# Patient Record
Sex: Male | Born: 1945 | Race: White | Hispanic: No | Marital: Married | Smoking: Never smoker
Health system: Southern US, Community
[De-identification: ages and names within clinical notes are randomized; demographics above are authoritative.]

## PROBLEM LIST (undated history)

## (undated) DIAGNOSIS — Z8546 Personal history of malignant neoplasm of prostate: Secondary | ICD-10-CM

## (undated) DIAGNOSIS — K219 Gastro-esophageal reflux disease without esophagitis: Secondary | ICD-10-CM

## (undated) DIAGNOSIS — I4891 Unspecified atrial fibrillation: Secondary | ICD-10-CM

## (undated) DIAGNOSIS — M5430 Sciatica, unspecified side: Secondary | ICD-10-CM

## (undated) DIAGNOSIS — M1990 Unspecified osteoarthritis, unspecified site: Secondary | ICD-10-CM

## (undated) DIAGNOSIS — G4730 Sleep apnea, unspecified: Secondary | ICD-10-CM

## (undated) HISTORY — PX: NOSE SURGERY: SHX723

## (undated) HISTORY — PX: PROSTATECTOMY: SHX69

## (undated) HISTORY — DX: Unspecified atrial fibrillation: I48.91

## (undated) HISTORY — DX: Sleep apnea, unspecified: G47.30

## (undated) HISTORY — PX: KNEE ARTHROSCOPY: SUR90

## (undated) HISTORY — PX: SHOULDER ARTHROSCOPY: SHX128

## (undated) HISTORY — PX: WISDOM TOOTH EXTRACTION: SHX21

## (undated) HISTORY — DX: Personal history of malignant neoplasm of prostate: Z85.46

---

## 2006-04-28 ENCOUNTER — Ambulatory Visit: Payer: Self-pay | Admitting: Gastroenterology

## 2007-09-28 ENCOUNTER — Ambulatory Visit: Payer: Self-pay | Admitting: Internal Medicine

## 2010-04-26 ENCOUNTER — Ambulatory Visit: Payer: Self-pay | Admitting: Internal Medicine

## 2010-09-08 ENCOUNTER — Inpatient Hospital Stay: Payer: Self-pay | Admitting: Internal Medicine

## 2010-10-11 ENCOUNTER — Ambulatory Visit: Payer: Self-pay | Admitting: Unknown Physician Specialty

## 2010-12-05 ENCOUNTER — Ambulatory Visit: Payer: Self-pay | Admitting: Unknown Physician Specialty

## 2010-12-18 ENCOUNTER — Ambulatory Visit: Payer: Self-pay | Admitting: Unknown Physician Specialty

## 2011-03-12 ENCOUNTER — Ambulatory Visit: Payer: Self-pay | Admitting: Internal Medicine

## 2011-03-26 ENCOUNTER — Ambulatory Visit: Payer: Self-pay | Admitting: Gastroenterology

## 2011-03-28 LAB — PATHOLOGY REPORT

## 2011-07-22 ENCOUNTER — Ambulatory Visit: Payer: Self-pay | Admitting: Family

## 2012-02-05 ENCOUNTER — Ambulatory Visit: Payer: Self-pay | Admitting: Internal Medicine

## 2013-02-03 ENCOUNTER — Ambulatory Visit: Payer: Self-pay | Admitting: Urology

## 2013-07-17 DIAGNOSIS — N529 Male erectile dysfunction, unspecified: Secondary | ICD-10-CM | POA: Insufficient documentation

## 2013-12-13 ENCOUNTER — Ambulatory Visit: Payer: Self-pay | Admitting: Gastroenterology

## 2013-12-15 LAB — PATHOLOGY REPORT

## 2014-01-10 ENCOUNTER — Ambulatory Visit: Payer: Self-pay | Admitting: Otolaryngology

## 2014-05-25 ENCOUNTER — Ambulatory Visit: Payer: Self-pay | Admitting: Internal Medicine

## 2014-10-27 IMAGING — CT CT PARANASAL SINUSES W/O CM
3 series · 14 of 47 positions shown, 16 images · non-contrast
Comparison: 04/26/2010 head CT.

CLINICAL DATA: Congestion and hoarseness. Feels as if ears are
clogged. No sense of smell.

EXAM:
CT PARANASAL SINUS WITHOUT CONTRAST
TECHNIQUE: Multidetector CT images of the paranasal sinuses were obtained using
the standard protocol without intravenous contrast.

[Series 3: ax soft · axial · 0.31mm/px · z∈[-116,+43]mm · 8 of 185 slices shown, 10 images]
[im 13/185  brain]
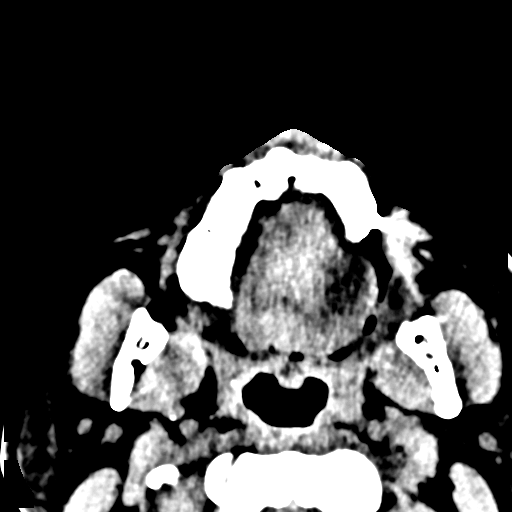
[im 13/185  bone]
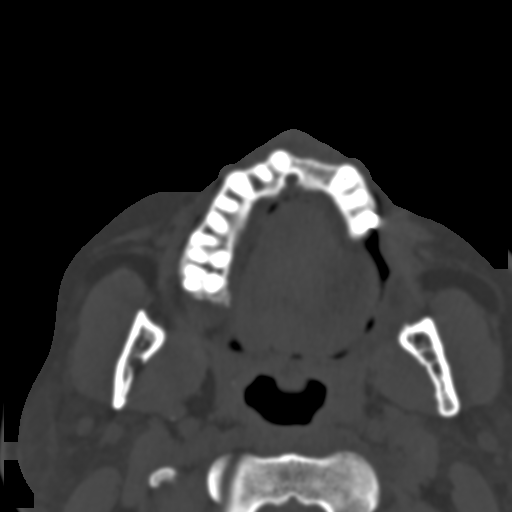
[im 39/185  bone]
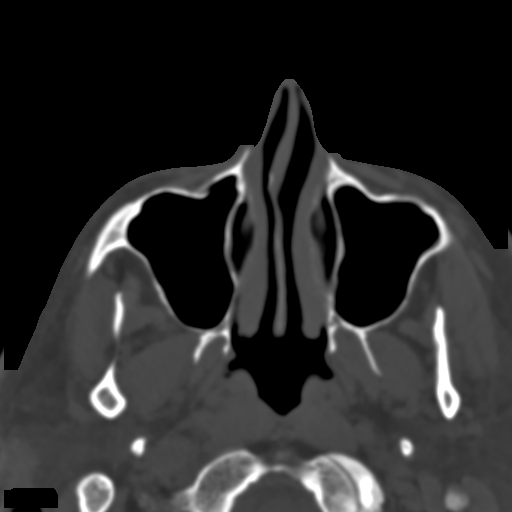
[im 58/185  bone]
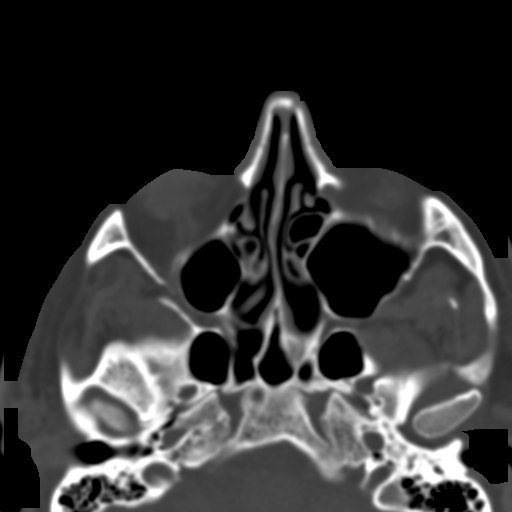
[im 83/185  bone]
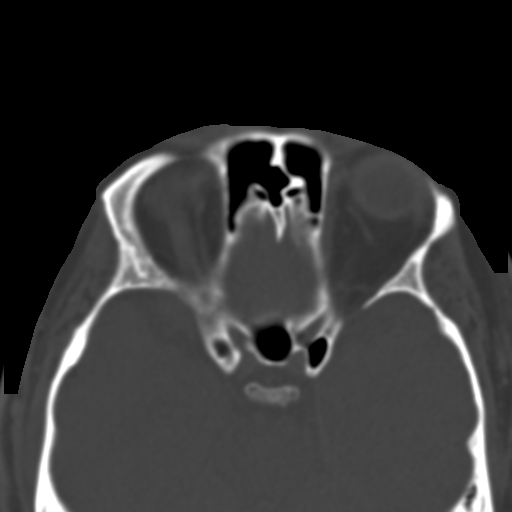
[im 102/185  brain]
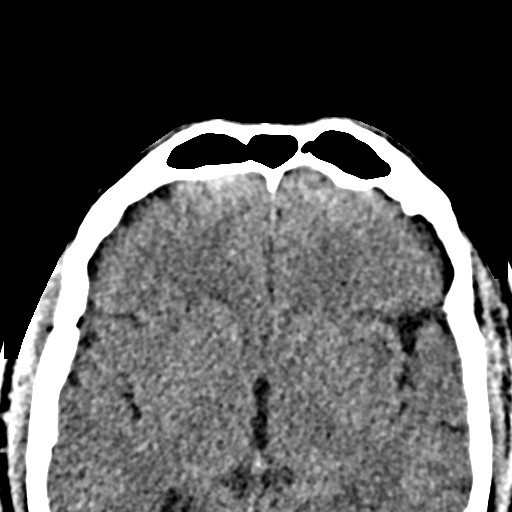
[im 102/185  bone]
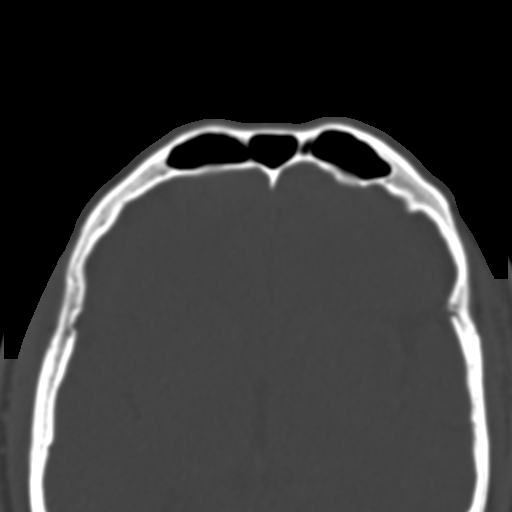
[im 127/185  bone]
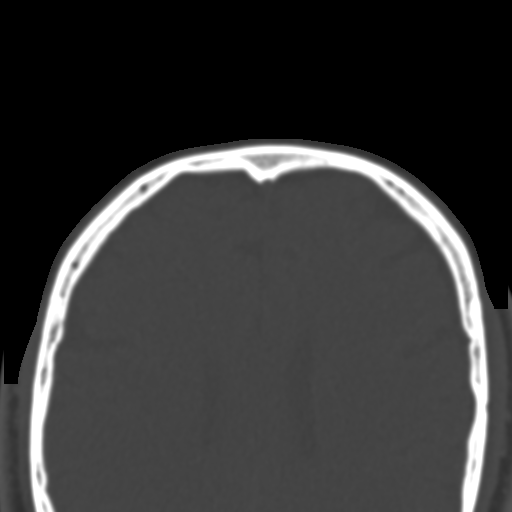
[im 146/185  bone]
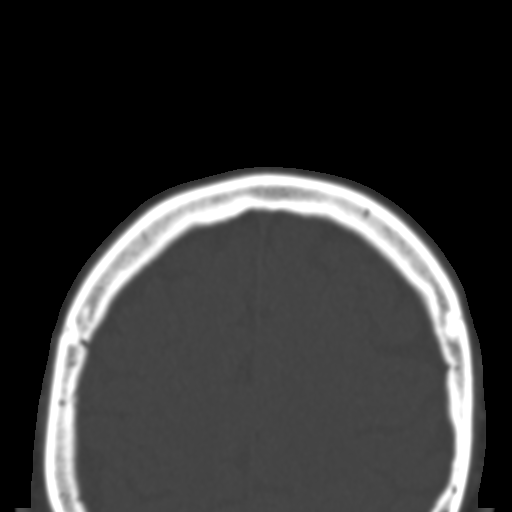
[im 172/185  bone]
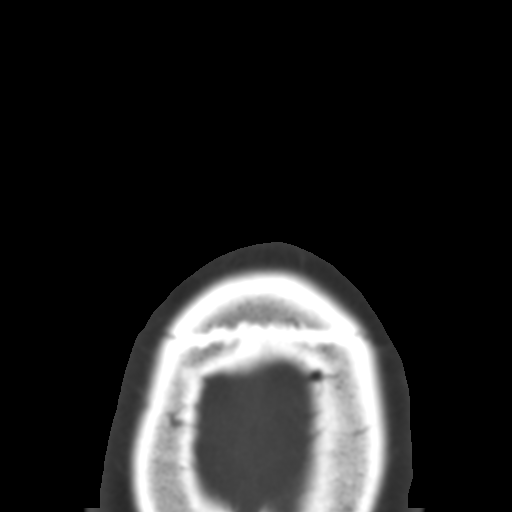

[Series 4: coronal bone · coronal · 0.33mm/px · 3 of 74 slices shown]
[im 25/74  bone]
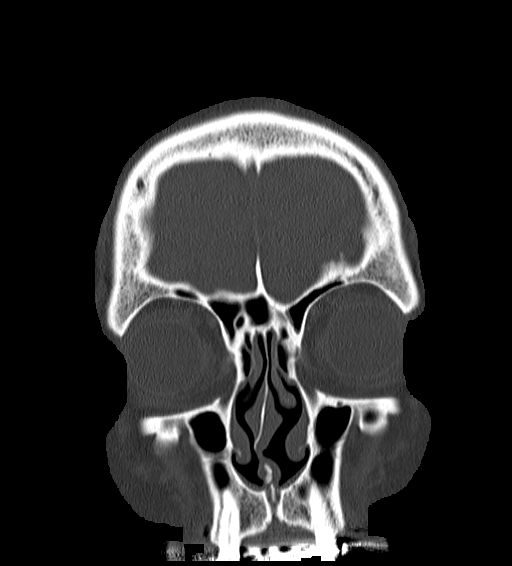
[im 33/74  bone]
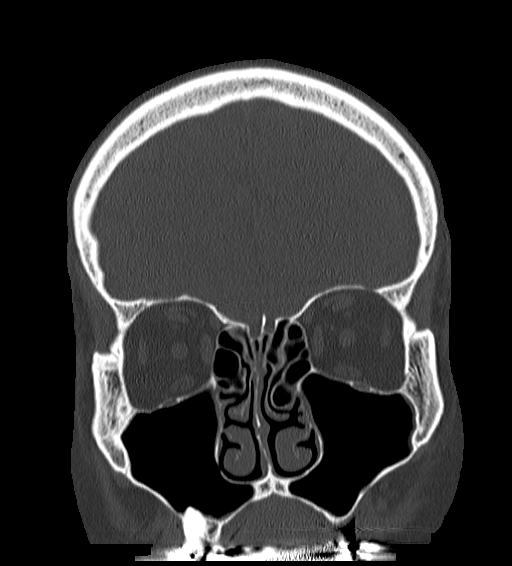
[im 41/74  bone]
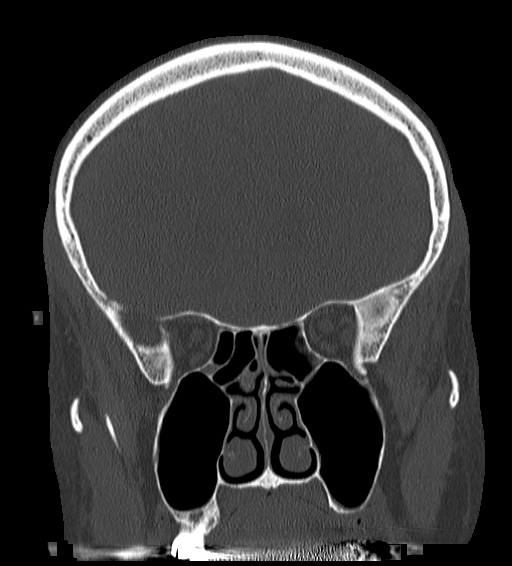

[Series 5: sagittal bone · sagittal · 0.29mm/px · 3 of 81 slices shown]
[im 27/81  bone]
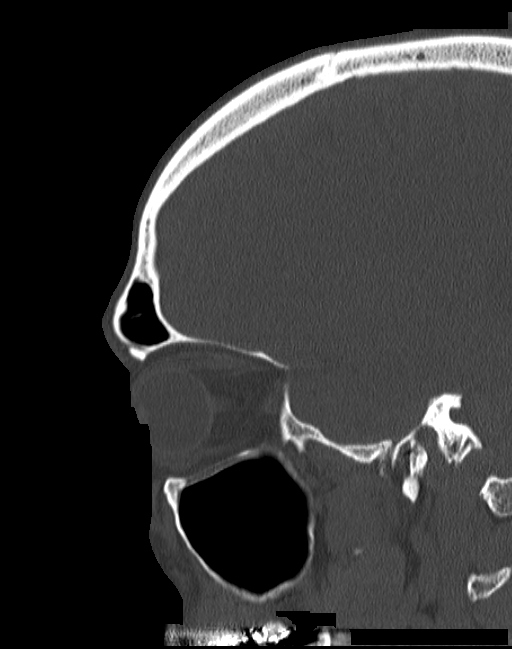
[im 41/81  bone]
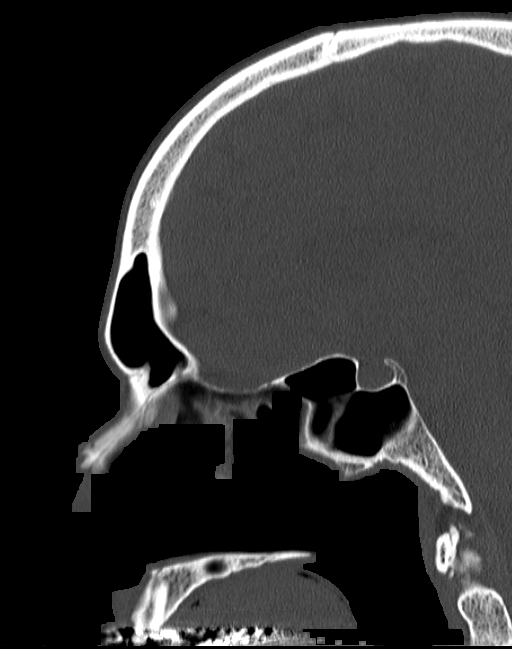
[im 54/81  bone]
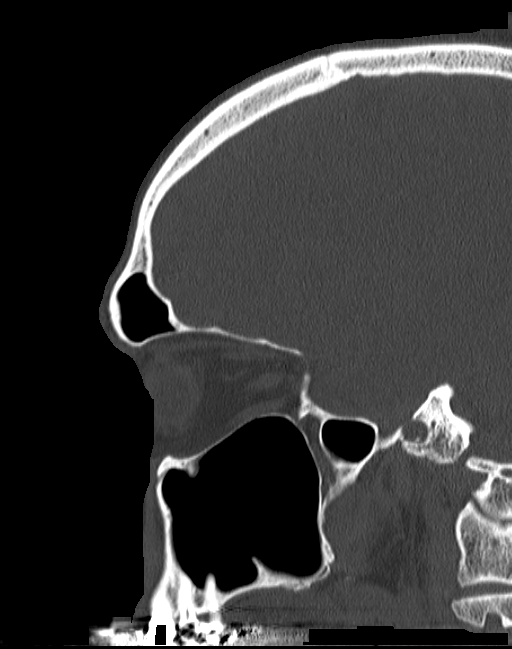

[14 of 47 positions shown; findings below may reference images not displayed]

FINDINGS: Minimal mucosal thickening inferior aspect right maxillary sinus and
lateral aspect left maxillary sinus.

Prominent anterior left ethmoid sinus air cell causes slight
deformity of the left uncinate process. Mild mucosal thickening
along the course of the infundibulum bilaterally which remains
patent.

Nasal septum slightly deviated to the right.

Frontal sinuses are clear.

Minimal mucosal thickening anterior left ethmoid sinus air cell
otherwise ethmoid sinus air cells are clear.

Sphenoid sinus air cells are clear. Aeration upper aspect of the
pterygoid plates clear. The carotid canal extends into the posterior
lateral aspect of the sphenoid sinus air cell bilaterally. Aeration
of the anterior clinoid. Minimal thinning of the bony cover of the
optic nerve as traverses through the superior lateral aspect of the
sphenoid sinus.

Middle ear cavities are clear bilaterally. Visualized mastoid air
cells are clear.

Visualized intracranial structures and orbital structures are
unremarkable.
IMPRESSION: Minimal mucosal thickening inferior aspect right maxillary sinus and
lateral aspect left maxillary sinus.

Prominent anterior left ethmoid sinus air cell causes slight
deformity of the left uncinate process. Mild mucosal thickening
along the course of patent infundibulum bilaterally.

Frontal sinuses are clear.

Minimal mucosal thickening anterior left ethmoid sinus air cell
otherwise ethmoid sinus air cells are clear. Cribriform plate
slightly asymmetric lower on the right.

Sphenoid sinus air cells are clear. Aeration upper aspect of the
pterygoid plates clear. The carotid canal extends into the posterior
lateral aspect of the sphenoid sinus air cell bilaterally. Aeration
of the anterior clinoid. Minimal thinning of the bony cover of the
optic nerve as traverses through the superior lateral aspect of the
sphenoid sinus.

Middle ear cavities are clear bilaterally. Visualized mastoid air
cells are clear.

## 2015-10-03 ENCOUNTER — Inpatient Hospital Stay: Admit: 2015-10-03 | Payer: Self-pay

## 2015-10-03 ENCOUNTER — Ambulatory Visit
Admission: RE | Admit: 2015-10-03 | Discharge: 2015-10-03 | Disposition: A | Discharge status: Home / Self Care | Payer: Medicare Other | Source: Ambulatory Visit | Attending: Internal Medicine | Admitting: Internal Medicine

## 2015-10-03 ENCOUNTER — Other Ambulatory Visit: Payer: Self-pay | Admitting: Internal Medicine

## 2015-10-03 ENCOUNTER — Ambulatory Visit
Admission: RE | Admit: 2015-10-03 | Discharge: 2015-10-03 | Disposition: A | Discharge status: Home / Self Care | Payer: Medicare Other | Source: Ambulatory Visit | Attending: Cardiology | Admitting: Cardiology

## 2015-10-03 DIAGNOSIS — J189 Pneumonia, unspecified organism: Secondary | ICD-10-CM | POA: Diagnosis present

## 2015-10-03 DIAGNOSIS — R0602 Shortness of breath: Secondary | ICD-10-CM

## 2015-10-03 DIAGNOSIS — R079 Chest pain, unspecified: Secondary | ICD-10-CM | POA: Insufficient documentation

## 2016-04-04 ENCOUNTER — Other Ambulatory Visit: Payer: Self-pay

## 2016-09-04 ENCOUNTER — Ambulatory Visit: Payer: Medicare Other | Attending: Neurology

## 2016-09-04 DIAGNOSIS — G4733 Obstructive sleep apnea (adult) (pediatric): Secondary | ICD-10-CM | POA: Diagnosis not present

## 2017-08-19 ENCOUNTER — Ambulatory Visit: Payer: Self-pay | Admitting: Urology

## 2017-08-26 ENCOUNTER — Ambulatory Visit (INDEPENDENT_AMBULATORY_CARE_PROVIDER_SITE_OTHER): Payer: Medicare Other | Admitting: Urology

## 2017-08-26 ENCOUNTER — Encounter: Payer: Self-pay | Admitting: Urology

## 2017-08-26 VITALS — BP 133/76 | HR 67 | Ht 72.0 in | Wt 249.4 lb

## 2017-08-26 DIAGNOSIS — Z8546 Personal history of malignant neoplasm of prostate: Secondary | ICD-10-CM | POA: Diagnosis not present

## 2017-08-26 DIAGNOSIS — R350 Frequency of micturition: Secondary | ICD-10-CM | POA: Diagnosis not present

## 2017-08-26 HISTORY — DX: Personal history of malignant neoplasm of prostate: Z85.46

## 2017-08-26 MED ORDER — MIRABEGRON ER 25 MG PO TB24: 25.0000 mg | ORAL_TABLET | Freq: Every day | ORAL | 0 refills | Status: DC

## 2017-08-26 NOTE — Progress Notes (Signed)
08/26/2017 1:53 PM   Darren Alu Sr. 1945-12-30 [id number]  Referring provider: Cletis Athens, MD [address] [place], [place] [address]  Chief Complaint  Patient presents with  . history of prostate cancer    HPI: 72 year old male presents for follow-up.  I previously followed him at [facility] and he was last seen in December 2017.  He underwent radical prostatectomy at [facility] in January 2005 for prostate cancer.  His PSA has remained undetectable.  He has been on Toviaz for several years for urinary frequency and urgency.  He has stable lower urinary tract symptoms.  Denies dysuria or gross hematuria.  He denies flank, abdominal, pelvic or scrotal pain.  He does complain of dry mouth and constipation with Lisbeth Ply and was asking about any alternative medications.  He was also on Edex for erectile dysfunction however his wife was recently hospitalized with sepsis and had an extended hospital stay and rehabilitation and he is currently not sexually active.   PMH: Past Medical History:  Diagnosis Date  . Atrial fibrillation ([place])   . History of prostate cancer   . Sleep apnea     Surgical History: Past Surgical History:  Procedure Laterality Date  . KNEE ARTHROSCOPY    . NOSE SURGERY    . PROSTATECTOMY    . SHOULDER ARTHROSCOPY    . WISDOM TOOTH EXTRACTION      Home Medications:  Allergies as of 08/26/2017   No Known Allergies     Medication List        Accurate as of 08/26/17  1:53 PM. Always use your most recent med list.          atenolol 50 MG tablet Commonly known as:  TENORMIN   citalopram 20 MG tablet Commonly known as:  CELEXA Take by mouth.   fenofibrate 160 MG tablet Take by mouth.   multivitamin capsule Take by mouth.   nitroGLYCERIN 0.3 MG SL tablet Commonly known as:  NITROSTAT PLACE 1 (ONE) TABLET SUBLINGUALLY (UNDER TONGUE) AS NEEDED   omeprazole 40 MG capsule Commonly known as:  PRILOSEC Take 40 mg by mouth.   prazosin 1 MG  capsule Commonly known as:  MINIPRESS Take 1 mg by mouth.   TOVIAZ 4 MG Tb24 tablet Generic drug:  fesoterodine Take 4 mg by mouth.   zolpidem 5 MG tablet Commonly known as:  AMBIEN       Allergies: No Known Allergies  Family History: Family History  Problem Relation Age of Onset  . Prostate cancer Neg Hx   . Bladder Cancer Neg Hx   . Kidney cancer Neg Hx     Social History:  reports that  has never smoked. he has never used smokeless tobacco. He reports that he does not drink alcohol or use drugs.  ROS: UROLOGY Frequent Urination?: No Hard to postpone urination?: Yes Burning/pain with urination?: No Get up at night to urinate?: Yes Leakage of urine?: No Urine stream starts and stops?: Yes Trouble starting stream?: Yes Do you have to strain to urinate?: Yes Blood in urine?: No Urinary tract infection?: No Sexually transmitted disease?: No Injury to kidneys or bladder?: No Painful intercourse?: No Weak stream?: No Erection problems?: No Penile pain?: No  Gastrointestinal Nausea?: No Vomiting?: No Indigestion/heartburn?: No Diarrhea?: No Constipation?: No  Constitutional Fever: No Night sweats?: No Weight loss?: No Fatigue?: No  Skin Skin rash/lesions?: No Itching?: No  Eyes Blurred vision?: No Double vision?: No  Ears/Nose/Throat Sore throat?: No Sinus problems?: Yes  Hematologic/Lymphatic Swollen glands?: No Easy bruising?: No  Cardiovascular Leg swelling?: No Chest pain?: No  Respiratory Cough?: No Shortness of breath?: No  Endocrine Excessive thirst?: No  Musculoskeletal Back pain?: Yes Joint pain?: No  Neurological Headaches?: No Dizziness?: No  Psychologic Depression?: No Anxiety?: No  Physical Exam: BP 133/76 (BP Location: Right Arm, Patient Position: Sitting, Cuff Size: Large)   Pulse 67   Ht 6' (1.829 m)   Wt 249 lb 6.4 oz (113.1 kg)   BMI 33.82 kg/m   Constitutional:  Alert and oriented, No acute  distress. HEENT: [place] AT, moist mucus membranes.  Trachea midline, no masses. Cardiovascular: No clubbing, cyanosis, or edema. Respiratory: Normal respiratory effort, no increased work of breathing. GI: Abdomen is soft, nontender, nondistended, no abdominal masses GU: No CVA tenderness. Skin: No rashes, bruises or suspicious lesions. Lymph: No cervical or inguinal adenopathy. Neurologic: Grossly intact, no focal deficits, moving all 4 extremities. Psychiatric: Normal mood and affect.    Assessment & Plan:   1. Personal history of prostate cancer He desires to continue PSA testing.  - PSA  2.  Urinary frequency/urgency Trial of Myrbetriq 25 mg daily.  He was given 2 weeks of samples and if he does not see any efficacy he will call back for dose titration.  We were out of 50 mg samples today.   Return in about 1 year (around 08/26/2018) for Recheck.  Abbie Sons, [place] [address], [place], [place] [address] [phone]

## 2017-08-27 LAB — PSA: Prostate Specific Ag, Serum: <0.1 ng/mL (ref 0.0–4.0)

## 2018-03-11 ENCOUNTER — Other Ambulatory Visit
Admission: RE | Admit: 2018-03-11 | Discharge: 2018-03-11 | Disposition: A | Discharge status: Home / Self Care | Payer: Medicare Other | Source: Ambulatory Visit | Attending: Internal Medicine | Admitting: Internal Medicine

## 2018-03-11 DIAGNOSIS — I10 Essential (primary) hypertension: Secondary | ICD-10-CM | POA: Diagnosis not present

## 2018-03-11 DIAGNOSIS — E119 Type 2 diabetes mellitus without complications: Secondary | ICD-10-CM | POA: Diagnosis present

## 2018-03-11 LAB — CBC
HCT: 40.5 % (ref 40.0–52.0)
Hemoglobin: 14.1 g/dL (ref 13.0–18.0)
MCH: 32.7 pg (ref 26.0–34.0)
MCHC: 34.8 g/dL (ref 32.0–36.0)
MCV: 93.8 fL (ref 80.0–100.0)
Platelets: 159 10*3/uL (ref 150–440)
RBC: 4.32 MIL/uL — ABNORMAL LOW (ref 4.40–5.90)
RDW: 13.1 % (ref 11.5–14.5)
WBC: 4.8 10*3/uL (ref 3.8–10.6)

## 2018-03-11 LAB — BASIC METABOLIC PANEL
Anion gap: 6 (ref 5–15)
BUN: 29 mg/dL — ABNORMAL HIGH (ref 8–23)
CO2: 27 mmol/L (ref 22–32)
Calcium: 9.1 mg/dL (ref 8.9–10.3)
Chloride: 107 mmol/L (ref 98–111)
Creatinine, Ser: 1.07 mg/dL (ref 0.61–1.24)
GFR calc Af Amer: >60 mL/min (ref >60)
GFR calc non Af Amer: >60 mL/min (ref >60)
Glucose, Bld: 91 mg/dL (ref 70–99)
Potassium: 4.2 mmol/L (ref 3.5–5.1)
Sodium: 140 mmol/L (ref 135–145)

## 2018-03-11 LAB — HEMOGLOBIN A1C
Hgb A1c MFr Bld: 5.7 % — ABNORMAL HIGH (ref 4.8–5.6)
Mean Plasma Glucose: 116.89 mg/dL

## 2018-03-11 LAB — TSH: TSH: 2.625 u[IU]/mL (ref 0.350–4.500)

## 2018-04-22 ENCOUNTER — Telehealth: Payer: Self-pay

## 2018-04-22 NOTE — Telephone Encounter (Signed)
Patient contacted office stated that he is calling for an appt for colon/EGD.  I informed him that we have not received a referral for him from Dr. Harless Litten office.  He said that he has been experiencing diarrhea for 30 days or so now and thought that Dr. Harless Litten office was sending referral earlier this week.  He then went on to say that he saw Dr. Allen Norris in the past.  I reviewed the chart and it has been over 3 years so we would definitely need the referral to get an appt.  He expressed understanding and said he will try to get in touch with the office manager or the nurse that works there.  Thanks [employer]

## 2018-04-29 ENCOUNTER — Encounter: Payer: Self-pay | Admitting: Gastroenterology

## 2018-04-29 ENCOUNTER — Ambulatory Visit (INDEPENDENT_AMBULATORY_CARE_PROVIDER_SITE_OTHER): Payer: Medicare Other | Admitting: Gastroenterology

## 2018-04-29 VITALS — BP 125/71 | HR 60 | Resp 16 | Ht 72.0 in | Wt 232.2 lb

## 2018-04-29 DIAGNOSIS — R197 Diarrhea, unspecified: Secondary | ICD-10-CM | POA: Diagnosis not present

## 2018-04-29 NOTE — Patient Instructions (Signed)
Food Choices to Help Relieve Diarrhea, Adult  When you have diarrhea, the foods you eat and your eating habits are very important. Choosing the right foods and drinks can help:  · Relieve diarrhea.  · Replace lost fluids and nutrients.  · Prevent dehydration.    What general guidelines should I follow?  Relieving diarrhea  · Choose foods with less than 2 g or .07 oz. of fiber per serving.  · Limit fats to less than 8 tsp (38 g or 1.34 oz.) a day.  · Avoid the following:  ? Foods and beverages sweetened with high-fructose corn syrup, honey, or sugar alcohols such as xylitol, sorbitol, and mannitol.  ? Foods that contain a lot of fat or sugar.  ? Fried, greasy, or spicy foods.  ? High-fiber grains, breads, and cereals.  ? Raw fruits and vegetables.  · Eat foods that are rich in probiotics. These foods include dairy products such as yogurt and fermented milk products. They help increase healthy bacteria in the stomach and intestines (gastrointestinal tract, or GI tract).  · If you have lactose intolerance, avoid dairy products. These may make your diarrhea worse.  · Take medicine to help stop diarrhea (antidiarrheal medicine) only as told by your health care provider.  Replacing nutrients  · Eat small meals or snacks every 3–4 hours.  · Eat bland foods, such as white rice, toast, or baked potato, until your diarrhea starts to get better. Gradually reintroduce nutrient-rich foods as tolerated or as told by your health care provider. This includes:  ? Well-cooked protein foods.  ? Peeled, seeded, and soft-cooked fruits and vegetables.  ? Low-fat dairy products.  · Take vitamin and mineral supplements as told by your health care provider.  Preventing dehydration    · Start by sipping water or a special solution to prevent dehydration (oral rehydration solution, ORS). Urine that is clear or pale yellow means that you are getting enough fluid.  · Try to drink at least 8–10 cups of fluid each day to help replace lost  fluids.  · You may add other liquids in addition to water, such as clear juice or decaffeinated sports drinks, as tolerated or as told by your health care provider.  · Avoid drinks with caffeine, such as coffee, tea, or soft drinks.  · Avoid alcohol.  What foods are recommended?  The items listed may not be a complete list. Talk with your health care provider about what dietary choices are best for you.  Grains  White rice. White, French, or pita breads (fresh or toasted), including plain rolls, buns, or bagels. White pasta. Saltine, soda, or graham crackers. Pretzels. Low-fiber cereal. Cooked cereals made with water (such as cornmeal, farina, or cream cereals). Plain muffins. Matzo. Melba toast. Zwieback.  Vegetables  Potatoes (without the skin). Most well-cooked and canned vegetables without skins or seeds. Tender lettuce.  Fruits  Apple sauce. Fruits canned in juice. Cooked apricots, cherries, grapefruit, peaches, pears, or plums. Fresh bananas and cantaloupe.  Meats and other protein foods  Baked or boiled chicken. Eggs. Tofu. Fish. Seafood. Smooth nut butters. Ground or well-cooked tender beef, ham, veal, lamb, pork, or poultry.  Dairy  Plain yogurt, kefir, and unsweetened liquid yogurt. Lactose-free milk, buttermilk, skim milk, or soy milk. Low-fat or nonfat hard cheese.  Beverages  Water. Low-calorie sports drinks. Fruit juices without pulp. Strained tomato and vegetable juices. Decaffeinated teas. Sugar-free beverages not sweetened with sugar alcohols. Oral rehydration solutions, if approved by your health care   provider.  Seasoning and other foods  Bouillon, broth, or soups made from recommended foods.  What foods are not recommended?  The items listed may not be a complete list. Talk with your health care provider about what dietary choices are best for you.  Grains  Whole grain, whole wheat, bran, or rye breads, rolls, pastas, and crackers. Wild or brown rice. Whole grain or bran cereals. Barley. Oats  and oatmeal. Corn tortillas or taco shells. Granola. Popcorn.  Vegetables  Raw vegetables. Fried vegetables. Cabbage, broccoli, Brussels sprouts, artichokes, baked beans, beet greens, corn, kale, legumes, peas, sweet potatoes, and yams. Potato skins. Cooked spinach and cabbage.  Fruits  Dried fruit, including raisins and dates. Raw fruits. Stewed or dried prunes. Canned fruits with syrup.  Meat and other protein foods  Fried or fatty meats. Deli meats. Chunky nut butters. Nuts and seeds. Beans and lentils. Bacon. Hot dogs. Sausage.  Dairy  High-fat cheeses. Whole milk, chocolate milk, and beverages made with milk, such as milk shakes. Half-and-half. Cream. sour cream. Ice cream.  Beverages  Caffeinated beverages (such as coffee, tea, soda, or energy drinks). Alcoholic beverages. Fruit juices with pulp. Prune juice. Soft drinks sweetened with high-fructose corn syrup or sugar alcohols. High-calorie sports drinks.  Fats and oils  Butter. Cream sauces. Margarine. Salad oils. Plain salad dressings. Olives. Avocados. Mayonnaise.  Sweets and desserts  Sweet rolls, doughnuts, and sweet breads. Sugar-free desserts sweetened with sugar alcohols such as xylitol and sorbitol.  Seasoning and other foods  Honey. Hot sauce. Chili powder. Gravy. Cream-based or milk-based soups. Pancakes and waffles.  Summary  · When you have diarrhea, the foods you eat and your eating habits are very important.  · Make sure you get at least 8–10 cups of fluid each day, or enough to keep your urine clear or pale yellow.  · Eat bland foods and gradually reintroduce healthy, nutrient-rich foods as tolerated, or as told by your health care provider.  · Avoid high-fiber, fried, greasy, or spicy foods.  This information is not intended to replace advice given to you by your health care provider. Make sure you discuss any questions you have with your health care provider.  Document Released: 10/18/2003 Document Revised: 07/25/2016 Document  Reviewed: 07/25/2016  Elsevier Interactive Patient Education © 2018 Elsevier Inc.

## 2018-04-30 LAB — COMPREHENSIVE METABOLIC PANEL
ALBUMIN: 4.7 g/dL (ref 3.5–4.8)
ALK PHOS: 64 IU/L (ref 39–117)
ALT: 19 IU/L (ref 0–44)
AST: 15 IU/L (ref 0–40)
Albumin/Globulin Ratio: 2.5 — ABNORMAL HIGH (ref 1.2–2.2)
BUN / CREAT RATIO: 20 (ref 10–24)
BUN: 21 mg/dL (ref 8–27)
Bilirubin Total: 0.4 mg/dL (ref 0.0–1.2)
CO2: 23 mmol/L (ref 20–29)
CREATININE: 1.05 mg/dL (ref 0.76–1.27)
Calcium: 9.4 mg/dL (ref 8.6–10.2)
Chloride: 107 mmol/L — ABNORMAL HIGH (ref 96–106)
GFR calc Af Amer: 82 mL/min/{1.73_m2} (ref >59)
GFR calc non Af Amer: 71 mL/min/{1.73_m2} (ref >59)
GLOBULIN, TOTAL: 1.9 g/dL (ref 1.5–4.5)
Glucose: 90 mg/dL (ref 65–99)
Potassium: 4.7 mmol/L (ref 3.5–5.2)
Sodium: 143 mmol/L (ref 134–144)
Total Protein: 6.6 g/dL (ref 6.0–8.5)

## 2018-04-30 LAB — CBC
Hematocrit: 38.4 % (ref 37.5–51.0)
Hemoglobin: 12.9 g/dL — ABNORMAL LOW (ref 13.0–17.7)
MCH: 31.5 pg (ref 26.6–33.0)
MCHC: 33.6 g/dL (ref 31.5–35.7)
MCV: 94 fL (ref 79–97)
PLATELETS: 186 10*3/uL (ref 150–450)
RBC: 4.09 x10E6/uL — ABNORMAL LOW (ref 4.14–5.80)
RDW: 13.5 % (ref 12.3–15.4)
WBC: 4.9 10*3/uL (ref 3.4–10.8)

## 2018-04-30 LAB — SEDIMENTATION RATE: Sed Rate: 2 mm/hr (ref 0–30)

## 2018-04-30 LAB — C-REACTIVE PROTEIN: CRP: 3 mg/L (ref 0–10)

## 2018-04-30 NOTE — Progress Notes (Signed)
Vonda Antigua [place]  [place], [place] [address]  Main: [phone]  Fax: [phone]   Gastroenterology Consultation  Referring Provider:     Cletis Athens, MD Primary Care Physician:  Cletis Athens, MD Primary Gastroenterologist:  Dr. Vonda Antigua Reason for Consultation:     Diarrhea        HPI:    Chief Complaint  Patient presents with  . Diarrhea  . Abdominal Pain    Darren VARELAS Sr. is a 72 y.o. y/o male referred for consultation & management  by Dr. Cletis Athens, MD.  Patient reports onset of diarrhea about 4 weeks ago, with 9-10 loose bowel movements a day.  No blood in stool.  No fever chills.  Since then, frequency of bowel movements has decreased, but states has loose bowel movements on the days that he does have a bowel movement.  He may go 1 or 2 days without a bowel movement, and then will have 3 loose bowel movements after that.  Also, just prior to him having the diarrhea, he has started a new diet where he was avoiding carbs.  He had substituted his beverages with diet drinks.  No recent travel, no new medications, no sick contacts.  He states his primary care provider did some blood work and stool testing.  Paperwork sent over from his primary care provider shows that he had fecal occult blood testing and CBC, but I do not see the results of this testing, and no GI panel for infectious work-up was ordered.  Past Medical History:  Diagnosis Date  . Atrial fibrillation ([place])   . History of prostate cancer   . Sleep apnea     Past Surgical History:  Procedure Laterality Date  . KNEE ARTHROSCOPY    . NOSE SURGERY    . PROSTATECTOMY    . SHOULDER ARTHROSCOPY    . WISDOM TOOTH EXTRACTION      Prior to Admission medications   Medication Sig Start Date End Date Taking? Authorizing Provider  atenolol (TENORMIN) 50 MG tablet  06/29/17  Yes [provider]  citalopram (CELEXA) 20 MG tablet Take by mouth. 09/27/09   Yes [provider]  fenofibrate 160 MG tablet Take by mouth. 06/07/09  Yes [provider]  fesoterodine (TOVIAZ) 4 MG TB24 tablet Take 4 mg by mouth. 06/29/13  Yes [provider]  mirabegron ER (MYRBETRIQ) 25 MG TB24 tablet Take 1 tablet (25 mg total) by mouth daily. 08/26/17  Yes Stoioff, Ronda Fairly, MD  Multiple Vitamin (MULTIVITAMIN) capsule Take by mouth.   Yes [provider]  nitroGLYCERIN (NITROSTAT) 0.3 MG SL tablet PLACE 1 (ONE) TABLET SUBLINGUALLY (UNDER TONGUE) AS NEEDED 10/19/15  Yes [provider]  omeprazole (PRILOSEC) 40 MG capsule Take 40 mg by mouth.   Yes [provider]  prazosin (MINIPRESS) 1 MG capsule Take 1 mg by mouth.   Yes [provider]  zolpidem (AMBIEN) 5 MG tablet  08/13/17  Yes [provider]    Family History  Problem Relation Age of Onset  . Prostate cancer Neg Hx   . Bladder Cancer Neg Hx   . Kidney cancer Neg Hx      Social History   Tobacco Use  . Smoking status: Never Smoker  . Smokeless tobacco: Never Used  Substance Use Topics  . Alcohol use: No    Frequency: Never  . Drug use: No    Allergies as of 04/29/2018  . (  No Known Allergies)    Review of Systems:    All systems reviewed and negative except where noted in HPI.   Physical Exam:  BP 125/71 (BP Location: Left Arm, Patient Position: Sitting, Cuff Size: Large)   Pulse 60   Resp 16   Ht 6' (1.829 m)   Wt 232 lb 3.2 oz (105.3 kg)   BMI 31.49 kg/m  No LMP for male patient. Psych:  Alert and cooperative. Normal mood and affect. General:   Alert,  Well-developed, well-nourished, pleasant and cooperative in NAD Head:  Normocephalic and atraumatic. Eyes:  Sclera clear, no icterus.   Conjunctiva pink. Ears:  Normal auditory acuity. Nose:  No deformity, discharge, or lesions. Mouth:  No deformity or lesions,oropharynx pink & moist. Neck:  Supple; no masses or thyromegaly. Abdomen:  Normal bowel sounds.  No  bruits.  Soft, non-tender and non-distended without masses, hepatosplenomegaly or hernias noted.  No guarding or rebound tenderness.    Msk:  Symmetrical without gross deformities. Good, equal movement & strength bilaterally. Pulses:  Normal pulses noted. Extremities:  No clubbing or edema.  No cyanosis. Neurologic:  Alert and oriented x3;  grossly normal neurologically. Skin:  Intact without significant lesions or rashes. No jaundice. Lymph Nodes:  No significant cervical adenopathy. Psych:  Alert and cooperative. Normal mood and affect.   Labs: CBC    Component Value Date/Time   WBC 4.9 04/29/2018 1434   WBC 4.8 03/11/2018 1212   RBC 4.09 (L) 04/29/2018 1434   RBC 4.32 (L) 03/11/2018 1212   HGB 12.9 (L) 04/29/2018 1434   HCT 38.4 04/29/2018 1434   PLT 186 04/29/2018 1434   MCV 94 04/29/2018 1434   MCH 31.5 04/29/2018 1434   MCH 32.7 03/11/2018 1212   MCHC 33.6 04/29/2018 1434   MCHC 34.8 03/11/2018 1212   RDW 13.5 04/29/2018 1434   CMP     Component Value Date/Time   NA 143 04/29/2018 1434   K 4.7 04/29/2018 1434   CL 107 (H) 04/29/2018 1434   CO2 23 04/29/2018 1434   GLUCOSE 90 04/29/2018 1434   GLUCOSE 91 03/11/2018 1212   BUN 21 04/29/2018 1434   CREATININE 1.05 04/29/2018 1434   CALCIUM 9.4 04/29/2018 1434   PROT 6.6 04/29/2018 1434   ALBUMIN 4.7 04/29/2018 1434   AST 15 04/29/2018 1434   ALT 19 04/29/2018 1434   ALKPHOS 64 04/29/2018 1434   BILITOT 0.4 04/29/2018 1434   GFRNONAA 71 04/29/2018 1434   GFRAA 82 04/29/2018 1434    Imaging Studies: No results found.  Assessment and Plan:   Darren LUCCHESI Sr. is a 72 y.o. y/o male has been referred for diarrhea  What he is describing now are loose stools as he does not have bowel movements daily in the days he has bowel movements they are loose This is compared to when had onset of symptoms 4 weeks ago, he was having 9-10 loose bowel movements a day  We will order stool studies for further work-up to rule  out infectious causes We will also check CRP, CBC and CMP  Patient was also on a new diet before symptoms started and was avoiding carbs, and substituting with sugar substitutes and diet drinks Patient informed to avoid sugary drinks and foods Handout given for the same Patient has reverted back to normal diet at this time, and he was encouraged to stay on a normal diet for now   Dr Vonda Antigua

## 2018-05-03 ENCOUNTER — Telehealth: Payer: Self-pay

## 2018-05-03 NOTE — Telephone Encounter (Signed)
Pt notified that stool samples were incomplete, no all the bottles were filled. He and his wife thought they were extra. Wife is coming by this afternoon to pick up sample containers.

## 2018-05-06 LAB — PANCREATIC ELASTASE, FECAL: Pancreatic Elastase, Fecal: 329 ug Elast./g (ref >200)

## 2018-05-06 LAB — CALPROTECTIN, FECAL: Calprotectin, Fecal: 48 ug/g (ref 0–120)

## 2018-05-07 LAB — GI PROFILE, STOOL, PCR

## 2018-05-07 LAB — CLOSTRIDIUM DIFFICILE EIA: C difficile Toxins A+B, EIA: NEGATIVE

## 2018-05-11 LAB — H. PYLORI ANTIGEN, STOOL: H pylori Ag, Stl: NEGATIVE

## 2018-05-11 LAB — FECAL LACTOFERRIN, QUANT: Lactoferrin, Fecal, Quant.: 2.59 ug/mL(g) (ref 0.00–7.24)

## 2018-05-24 ENCOUNTER — Telehealth: Payer: Self-pay

## 2018-05-24 NOTE — Telephone Encounter (Signed)
LMTCO.

## 2018-05-24 NOTE — Telephone Encounter (Signed)
-----   Message from Virgel Manifold, MD sent at 05/21/2018  4:20 PM EDT ----- Jackelyn Poling please let patient know, his stool studies were normal. His Hgb was slightly lower, but he is not having any bleeding, he should follow up with me in clinic as scheduled

## 2018-05-24 NOTE — Telephone Encounter (Signed)
Pt notified. Pt denies any bleeding and has appt on 05/31/2018 in clinic.

## 2018-05-31 ENCOUNTER — Ambulatory Visit: Payer: Medicare Other | Admitting: Gastroenterology

## 2018-05-31 ENCOUNTER — Encounter: Payer: Self-pay | Admitting: Gastroenterology

## 2018-05-31 VITALS — BP 110/64 | HR 69 | Ht 72.0 in | Wt 232.4 lb

## 2018-05-31 DIAGNOSIS — D649 Anemia, unspecified: Secondary | ICD-10-CM

## 2018-05-31 NOTE — Progress Notes (Signed)
 [place] Tahiliani, MD [address]  [address]  [place], [place] [address]  Main: [phone]  Fax: [phone]   Primary Care Physician: Masoud, Javed, MD  Primary Gastroenterologist:  Dr. Varnita [place]  Chief Complaint  Patient presents with  . Follow-up    diarrhea    HPI: Darren G Sprenkle Sr. is a 72 y.o. male she has a follow-up of loose stools which have completely resolved.  Patient's is not having formed bowel movements every day or every other day.  Reports an episode of left lower quadrant abdominal pain for 3 days, cramping, intermittent, 2/10, nonradiating that self resolved, and occurred about a few weeks ago.  No nausea vomiting, weight loss, heartburn, dysphagia, melena, hematochezia, abdominal pain at this time.  Patient feels well and has no complaints otherwise.  His work-up included stool testing with C. difficile, GI panel, pancreatic elastase and fecal calprotectin which were all normal.  Stool for H. pylori, and stool lactoferrin was normal as well.  Serology with ESR CRP were normal.  Normal liver enzymes.  CBC showed mildly decreased hemoglobin to 12.9.  Last colonoscopy was in May 2015 by Dr. Wohl, and four 4 7 mm sigmoid colon polyps removed and showed hyperplastic polyps.  Internal hemorrhoids were reported.  Patient has family history of colon cancer in his brother.  See provation for procedure report  Current Outpatient Medications  Medication Sig Dispense Refill  . atenolol (TENORMIN) 50 MG tablet     . citalopram (CELEXA) 20 MG tablet Take by mouth.    . fenofibrate 160 MG tablet Take by mouth.    . fesoterodine (TOVIAZ) 4 MG TB24 tablet Take 4 mg by mouth.    . mirabegron ER (MYRBETRIQ) 25 MG TB24 tablet Take 1 tablet (25 mg total) by mouth daily. 14 tablet 0  . Multiple Vitamin (MULTIVITAMIN) capsule Take by mouth.    . nitroGLYCERIN (NITROSTAT) 0.3 MG SL tablet PLACE 1 (ONE) TABLET SUBLINGUALLY (UNDER TONGUE) AS NEEDED    . omeprazole  (PRILOSEC) 40 MG capsule Take 40 mg by mouth.    . prazosin (MINIPRESS) 1 MG capsule Take 1 mg by mouth.    . zolpidem (AMBIEN) 5 MG tablet      No current facility-administered medications for this visit.     Allergies as of 05/31/2018  . (No Known Allergies)    ROS:  General: Negative for anorexia, weight loss, fever, chills, fatigue, weakness. ENT: Negative for hoarseness, difficulty swallowing , nasal congestion. CV: Negative for chest pain, angina, palpitations, dyspnea on exertion, peripheral edema.  Respiratory: Negative for dyspnea at rest, dyspnea on exertion, cough, sputum, wheezing.  GI: See history of present illness. GU:  Negative for dysuria, hematuria, urinary incontinence, urinary frequency, nocturnal urination.  Endo: Negative for unusual weight change.    Physical Examination:   There were no vitals taken for this visit.  General: Well-nourished, well-developed in no acute distress.  Eyes: No icterus. Conjunctivae pink. Mouth: Oropharyngeal mucosa moist and pink , no lesions erythema or exudate. Neck: Supple, Trachea midline Abdomen: Bowel sounds are normal, nontender, nondistended, no hepatosplenomegaly or masses, no abdominal bruits or hernia , no rebound or guarding.   Extremities: No lower extremity edema. No clubbing or deformities. Neuro: Alert and oriented x 3.  Grossly intact. Skin: Warm and dry, no jaundice.   Psych: Alert and cooperative, normal mood and affect.   Labs: CMP     Component Value Date/Time   NA 143 04/29/2018 1434     K 4.7 04/29/2018 1434   CL 107 (H) 04/29/2018 1434   CO2 23 04/29/2018 1434   GLUCOSE 90 04/29/2018 1434   GLUCOSE 91 03/11/2018 1212   BUN 21 04/29/2018 1434   CREATININE 1.05 04/29/2018 1434   CALCIUM 9.4 04/29/2018 1434   PROT 6.6 04/29/2018 1434   ALBUMIN 4.7 04/29/2018 1434   AST 15 04/29/2018 1434   ALT 19 04/29/2018 1434   ALKPHOS 64 04/29/2018 1434   BILITOT 0.4 04/29/2018 1434   GFRNONAA 71  04/29/2018 1434   GFRAA 82 04/29/2018 1434   Lab Results  Component Value Date   WBC 4.9 04/29/2018   HGB 12.9 (L) 04/29/2018   HCT 38.4 04/29/2018   MCV 94 04/29/2018   PLT 186 04/29/2018    Imaging Studies: No results found.  Assessment and Plan:   [name] is a 72 y.o. y/o male here for follow-up of loose stool that have completely resolved and were likely due to infectious causes versus diet changes that patient had made at that time  Stool and serology work-up is very reassuring No further indicated since symptoms have resolved  CBC showed mildly decreased hemoglobin to 12.9, without any signs of bleeding from any source We will repeat and if normal, no further work-up needed If abnormal, will consider EGD and colonoscopy  Patient educated that his next colonoscopy should be 5 years from May 2015 due to family history of colon cancer.  Will set recall.  Follow-up as needed  Dr [place] 

## 2018-06-01 ENCOUNTER — Other Ambulatory Visit
Admission: RE | Admit: 2018-06-01 | Discharge: 2018-06-01 | Disposition: A | Discharge status: Home / Self Care | Payer: Medicare Other | Source: Ambulatory Visit | Attending: Gastroenterology | Admitting: Gastroenterology

## 2018-06-01 DIAGNOSIS — E785 Hyperlipidemia, unspecified: Secondary | ICD-10-CM | POA: Insufficient documentation

## 2018-06-01 DIAGNOSIS — E119 Type 2 diabetes mellitus without complications: Secondary | ICD-10-CM | POA: Diagnosis present

## 2018-06-01 DIAGNOSIS — I10 Essential (primary) hypertension: Secondary | ICD-10-CM | POA: Insufficient documentation

## 2018-06-01 LAB — HEMOGLOBIN: HEMOGLOBIN: 13.6 g/dL (ref 13.0–17.0)

## 2018-08-26 ENCOUNTER — Ambulatory Visit: Payer: Medicare Other | Admitting: Urology

## 2018-09-09 ENCOUNTER — Encounter: Payer: Self-pay | Admitting: Urology

## 2018-09-09 ENCOUNTER — Ambulatory Visit (INDEPENDENT_AMBULATORY_CARE_PROVIDER_SITE_OTHER): Payer: Medicare Other | Admitting: Urology

## 2018-09-09 VITALS — BP 132/66 | HR 56 | Ht 72.0 in | Wt 241.0 lb

## 2018-09-09 DIAGNOSIS — N3281 Overactive bladder: Secondary | ICD-10-CM | POA: Insufficient documentation

## 2018-09-09 DIAGNOSIS — E785 Hyperlipidemia, unspecified: Secondary | ICD-10-CM

## 2018-09-09 DIAGNOSIS — I10 Essential (primary) hypertension: Secondary | ICD-10-CM | POA: Insufficient documentation

## 2018-09-09 DIAGNOSIS — Z8546 Personal history of malignant neoplasm of prostate: Secondary | ICD-10-CM | POA: Diagnosis not present

## 2018-09-09 HISTORY — DX: Hyperlipidemia, unspecified: E78.5

## 2018-09-09 MED ORDER — MIRABEGRON ER 50 MG PO TB24: 50.0000 mg | ORAL_TABLET | Freq: Every day | ORAL | 11 refills | Status: DC

## 2018-09-09 NOTE — Patient Instructions (Signed)
Prostate Cancer  The prostate is a walnut-sized gland that is involved in the production of semen. It is located below a man's bladder, in front of the rectum. Prostate cancer is the abnormal growth of cells in the prostate gland. What are the causes? The exact cause of this condition is not known. What increases the risk? This condition is more likely to develop in men who:  Are older than age 73.  Are African-American.  Are obese.  Have a family history of prostate cancer.  Have a family history of breast cancer. What are the signs or symptoms? Symptoms of this condition include:  A need to urinate often.  Weak or interrupted flow of urine.  Trouble starting or stopping urination.  Inability to urinate.  Pain or burning during urination.  Painful ejaculation.  Blood in urine or semen.  Persistent pain or discomfort in the lower back, lower abdomen, hips, or upper thighs.  Trouble getting an erection.  Trouble emptying the bladder all the way. How is this diagnosed? This condition can be diagnosed with:  A digital rectal exam. For this exam, a health care provider inserts a gloved finger into the rectum to feel the prostate gland.  A blood test called a prostate-specific antigen (PSA) test.  An imaging test called transrectal ultrasonography.  A procedure in which a sample of tissue is taken from the prostate and examined under a microscope (prostate biopsy). Once the condition is diagnosed, tests will be done to determine how far the cancer has spread. This is called staging the cancer. Staging may involve imaging tests, such as:  A bone scan.  A CT scan.  A PET scan.  An MRI. The stages of prostate cancer are as follows:  Stage I. At this stage, the cancer is found in the prostate only. The cancer is not visible on imaging tests and it is usually found by accident, such as during a prostate surgery.  Stage II. At this stage, the cancer is more advanced  than it is in stage I, but the cancer has not spread outside the prostate.  Stage III. At this stage, the cancer has spread beyond the outer layer of the prostate to nearby tissues. The cancer may be found in the seminal vesicles, which are near the bladder and the prostate.  Stage IV. At this stage, the cancer has spread other parts of the body, such as the lymph nodes, bones, bladder, rectum, liver, or lungs. How is this treated? Treatment for this condition depends on several factors, including the stage of the cancer, your age, personal preferences, and your overall health. Talk with your health care provider about treatment options that are recommended for you. Common treatments include:  Observation for early stage prostate cancer (active surveillance). This involves having exams, blood tests, and in some cases, more biopsies. For some men, this is the only treatment needed.  Surgery. Types of surgeries include: ? Open surgery. In this surgery, a larger incision is made to remove the prostate. ? A laparoscopic prostatectomy. This is a surgery to remove the prostate and lymph nodes through several, small incisions. It is often referred to as a minimally invasive surgery. ? A robotic prostatectomy. This is a surgery to remove the prostate and lymph nodes with the help of a robotic arm that is controlled by a computer. ? Orchiectomy. This is a surgery to remove the testicles. ? Cryosurgery. This is a surgery to freeze and destroy cancer cells.  Radiation treatment. Types   of radiation treatment include: ? External beam radiation. This type aims beams of radiation from outside the body at the prostate to destroy cancerous cells. ? Brachytherapy. This type uses radioactive needles, seeds, wires, or tubes that are implanted into the prostate gland. Like external beam radiation, brachytherapy destroys cancerous cells. An advantage is that this type of radiation limits the damage to surrounding  tissue and has fewer side effects.  High-intensity, focused ultrasonography. This treatment destroys cancer cells by delivering high-energy ultrasound waves to the cancerous cells.  Chemotherapy medicines. This treatment kills cancer cells or stops them from multiplying.  Hormone treatment. This treatment involves taking medicines that act on one of the male hormones (testosterone): ? By stopping your body from producing testosterone. ? By blocking testosterone from reaching cancer cells. Follow these instructions at home:  Take over-the-counter and prescription medicines only as told by your health care provider.  Maintain a healthy diet.  Get plenty of sleep.  Consider joining a support group for men who have prostate cancer. Meeting with a support group may help you learn to cope with the stress of having cancer.  Keep all follow-up visits as told by your health care provider. This is important.  If you have to go to the hospital, notify your cancer specialist (oncologist).  Treatment for prostate cancer may affect sexual function. Continue to have intimate moments with your partner. This may include touching, holding, hugging, and caressing. Contact a health care provider if:  You have trouble urinating.  You have blood in your urine.  You have pain in your hips, back, or chest. Get help right away if:  You have weakness or numbness in your legs.  You cannot control urination or your bowel movements (incontinence).  You have trouble breathing.  You have sudden chest pain.  You have chills or a fever. Summary  The prostate is a walnut-sized gland that is involved in the production of semen. It is located below a man's bladder, in front of the rectum. Prostate cancer is the abnormal growth of cells in the prostate gland.  Treatment for this condition depends on several factors, including the stage of the cancer, your age, personal preferences, and your overall health.  Talk with your health care provider about treatment options that are recommended for you.  Consider joining a support group for men who have prostate cancer. Meeting with a support group may help you learn to cope with the stress of having cancer. This information is not intended to replace advice given to you by your health care provider. Make sure you discuss any questions you have with your health care provider. Document Released: 07/28/2005 Document Revised: 04/01/2017 Document Reviewed: 04/07/2016 Elsevier Interactive Patient Education  2019 Elsevier Inc.  

## 2018-09-09 NOTE — Progress Notes (Signed)
09/09/2018  11:22 AM   [place]ren Alu Sr. 11-09-1945 [id number]  Referring provider: Cletis Athens, MD [address] [place], [place] [address]  Chief Complaint  Patient presents with  . Follow-up   Urologic History: 1. Prostate Cancer  - Radical prostatectomy at [facility] in 08/2003  - PSA remains undetectable; Patient elected to continue PSA surveillance   2. Urinary Frequency/Urgency  - Previously chronically on Toviaz 4 mg with SE of dry mouth and constipation  - Trial of Myrbetriq 25 mg daily started on 08/2017  3. ED  - Previously on Edex  - Not sexually active at this time   HPI: [place]ren SOMERS Sr. is a 73 y.o. White or Caucasian male with a personal history of prostate cancer and urinary frequency/urgency that presents today for annual symptom re-evaluation.  - PSA drawn today  -  He stopped Toviaz to OTC Equate "Prostate Health Beta Plus" due to financial concerns  - Given Myrbetriq samples at his last visit but he does not recall if trial was effective  - He is interested in other ED treatment options outside of injections as per his wife    PMH: Past Medical History:  Diagnosis Date  . Atrial fibrillation ([place])   . History of prostate cancer   . Sleep apnea    Surgical History: Past Surgical History:  Procedure Laterality Date  . KNEE ARTHROSCOPY    . NOSE SURGERY    . PROSTATECTOMY    . SHOULDER ARTHROSCOPY    . WISDOM TOOTH EXTRACTION     Home Medications:  Allergies as of 09/09/2018   No Known Allergies     Medication List       Accurate as of September 09, 2018 11:22 AM. Always use your most recent med list.        atenolol 50 MG tablet Commonly known as:  TENORMIN   citalopram 20 MG tablet Commonly known as:  CELEXA Take by mouth.   fenofibrate 160 MG tablet Take by mouth.   mirabegron ER 50 MG Tb24 tablet Commonly known as:  MYRBETRIQ Take 1 tablet (50 mg total) by mouth daily.   mometasone 50 MCG/ACT nasal spray Commonly known as:   NASONEX   multivitamin capsule Take by mouth.   nitroGLYCERIN 0.3 MG SL tablet Commonly known as:  NITROSTAT PLACE 1 (ONE) TABLET SUBLINGUALLY (UNDER TONGUE) AS NEEDED   omeprazole 40 MG capsule Commonly known as:  PRILOSEC Take 40 mg by mouth.   prazosin 1 MG capsule Commonly known as:  MINIPRESS Take 1 mg by mouth.   PROSTATE SUPPORT PO Take by mouth.   tretinoin 0.05 % cream Commonly known as:  RETIN-A   zolpidem 5 MG tablet Commonly known as:  AMBIEN      Allergies: No Known Allergies  Family History: Family History  Problem Relation Age of Onset  . Prostate cancer Neg Hx   . Bladder Cancer Neg Hx   . Kidney cancer Neg Hx    Social History:  reports that he has never smoked. He has never used smokeless tobacco. He reports that he does not drink alcohol or use drugs.  ROS: UROLOGY Frequent Urination?: Yes Hard to postpone urination?: Yes Burning/pain with urination?: No Get up at night to urinate?: Yes Leakage of urine?: Yes Urine stream starts and stops?: Yes Trouble starting stream?: No Do you have to strain to urinate?: Yes Blood in urine?: No Urinary tract infection?: No Sexually transmitted disease?: No Injury to kidneys or bladder?:  No Painful intercourse?: No Weak stream?: No Erection problems?: Yes Penile pain?: No  Gastrointestinal Nausea?: No Vomiting?: No Indigestion/heartburn?: No Diarrhea?: No Constipation?: No  Constitutional Fever: No Night sweats?: No Weight loss?: No Fatigue?: No  Skin Skin rash/lesions?: No Itching?: No  Eyes Blurred vision?: No Double vision?: No  Ears/Nose/Throat Sore throat?: No Sinus problems?: No  Hematologic/Lymphatic Swollen glands?: No Easy bruising?: No  Cardiovascular Leg swelling?: No Chest pain?: No  Respiratory Cough?: No Shortness of breath?: No  Endocrine Excessive thirst?: No  Musculoskeletal Back pain?: Yes Joint pain?: Yes  Neurological Headaches?:  No Dizziness?: No  Psychologic Depression?: No Anxiety?: No  Physical Exam: BP 132/66 (BP Location: Left Arm, Patient Position: Sitting, Cuff Size: Normal)   Pulse (!) 56   Ht 6' (1.829 m)   Wt 241 lb (109.3 kg)   BMI 32.69 kg/m   Constitutional:  Alert and oriented, No acute distress. Respiratory: Normal respiratory effort, no increased work of breathing. Head: Normocephalic and Atraumatic. GU: No CVA tenderness Skin: No rashes, bruises or suspicious lesions. Neurologic: Grossly intact, no focal deficits, moving all 4 extremities. Psychiatric: Normal mood and affect.   Assessment & Plan:    1. Personal History of prostate cancer  - PSA drawn today  2. Urinary Frequency/Urgency  - he stopped Toviaz to OTC Equate "Prostate Health Beta Plus" due to financial concerns  - Given Myrbetriq samples at his last visit but he does not recall if trial was effective  - Discussed plan to trial Myrbetriq 50 mg qd samples and sent script to determine coverage. If not covered, then we would decide among medications that are covered under his insurer  3. ED   He is interested in other ED treatment options outside of injections as per his wife  - Discussed implants v. newly established shockwave therapy. Patient would like to discuss these options with his wife.    Return in about 1 year (around 09/10/2019) for follow up with PSA prior.   Abbie Sons, MD [facility] [address], [place], [place] [address] [phone]  I, [phone] Renne Crigler , am acting as a scribe for Abbie Sons, MD  I, Abbie Sons, MD, have reviewed all documentation for this visit. The documentation on 09/09/18 for the exam, diagnosis, procedures, and orders are all accurate and complete.

## 2018-09-10 LAB — PSA

## 2018-09-20 ENCOUNTER — Telehealth: Payer: Self-pay

## 2018-09-20 NOTE — Telephone Encounter (Signed)
Left message for patient to return call to office. 

## 2018-09-20 NOTE — Telephone Encounter (Signed)
Pt returned call to office and I read message from [facility].

## 2018-09-20 NOTE — Telephone Encounter (Signed)
-----   Message from Abbie Sons, MD sent at 09/19/2018 12:43 PM EST ----- PSA remains undetectable at <0.1

## 2018-10-18 ENCOUNTER — Other Ambulatory Visit: Payer: Self-pay

## 2018-10-18 ENCOUNTER — Telehealth: Payer: Self-pay | Admitting: Gastroenterology

## 2018-10-18 DIAGNOSIS — Z1211 Encounter for screening for malignant neoplasm of colon: Secondary | ICD-10-CM

## 2018-10-18 NOTE — Telephone Encounter (Signed)
[name] left vm to get  Pt scheduled for Colonoscopy

## 2018-10-18 NOTE — Telephone Encounter (Signed)
Patient's wife called again to schedule patient's colonoscopy

## 2018-10-18 NOTE — Telephone Encounter (Signed)
Returned patients wife's call to schedule her husbands colonoscopy.  His last office visit was with Dr. Bonna Gains on 05/31/18.  Dr. Bonna Gains noted "Colonoscopy should be 5 years from May 15 due to family history of colon cancer".  However patients wife said they would prefer Dr. Allen Norris to do the colonoscopy because they have been getting their colonoscopies with Dr. Allen Norris in the past and Dr. Allen Norris just makes them feel so special.  She said its absolutely nothing against Dr. Bonna Gains, its just that they have a special relationship with Dr. Allen Norris.  I advised patients wife that I did not think this would be a problem however professional courtesy is to make sure both physicians are okay with the request.  Please advise regarding which physician to schedule colon with.  Thanks [employer]

## 2018-10-19 ENCOUNTER — Encounter: Payer: Self-pay | Admitting: Gastroenterology

## 2018-10-20 ENCOUNTER — Other Ambulatory Visit: Payer: Self-pay

## 2018-10-20 DIAGNOSIS — Z1211 Encounter for screening for malignant neoplasm of colon: Secondary | ICD-10-CM

## 2018-11-01 ENCOUNTER — Telehealth: Payer: Self-pay

## 2018-11-01 NOTE — Telephone Encounter (Signed)
LVM to inform patient his colonoscopy scheduled for 03/30 has been canceled due to COVID 19 and we will contact him once restrictions have been lifted to reschedule.  Thank you,  Sharyn Lull

## 2018-11-08 ENCOUNTER — Ambulatory Visit: Admit: 2018-11-08 | Payer: Medicare Other | Admitting: Gastroenterology

## 2018-11-08 SURGERY — COLONOSCOPY WITH PROPOFOL
Anesthesia: Choice

## 2018-11-09 ENCOUNTER — Ambulatory Visit: Admission: RE | Admit: 2018-11-09 | Payer: Medicare Other | Source: Home / Self Care | Admitting: Gastroenterology

## 2018-11-09 ENCOUNTER — Encounter: Admission: RE | Payer: Self-pay | Source: Home / Self Care

## 2018-11-09 SURGERY — COLONOSCOPY WITH PROPOFOL
Anesthesia: General

## 2018-11-16 ENCOUNTER — Other Ambulatory Visit: Payer: Self-pay

## 2018-11-16 ENCOUNTER — Telehealth: Payer: Self-pay

## 2018-11-16 DIAGNOSIS — Z1211 Encounter for screening for malignant neoplasm of colon: Secondary | ICD-10-CM

## 2018-11-16 NOTE — Telephone Encounter (Signed)
Patients colonoscopy has been rescheduled to June 15th with Dr. Allen Norris at [facility].  New instructions to be mailed.  Thanks [employer]

## 2019-01-18 ENCOUNTER — Other Ambulatory Visit: Payer: Self-pay

## 2019-01-18 ENCOUNTER — Encounter: Payer: Self-pay | Admitting: *Deleted

## 2019-01-18 DIAGNOSIS — Z01812 Encounter for preprocedural laboratory examination: Secondary | ICD-10-CM

## 2019-01-20 ENCOUNTER — Other Ambulatory Visit: Payer: Medicare Other

## 2019-01-21 ENCOUNTER — Other Ambulatory Visit
Admission: RE | Admit: 2019-01-21 | Discharge: 2019-01-21 | Disposition: A | Discharge status: Home / Self Care | Payer: Medicare Other | Source: Ambulatory Visit | Attending: Gastroenterology | Admitting: Gastroenterology

## 2019-01-21 ENCOUNTER — Other Ambulatory Visit: Payer: Self-pay

## 2019-01-21 DIAGNOSIS — Z01812 Encounter for preprocedural laboratory examination: Secondary | ICD-10-CM | POA: Diagnosis present

## 2019-01-21 DIAGNOSIS — Z1159 Encounter for screening for other viral diseases: Secondary | ICD-10-CM | POA: Diagnosis not present

## 2019-01-21 NOTE — Discharge Instructions (Signed)
General Anesthesia, Adult, Care After  This sheet gives you information about how to care for yourself after your procedure. Your health care provider may also give you more specific instructions. If you have problems or questions, contact your health care provider.  What can I expect after the procedure?  After the procedure, the following side effects are common:  Pain or discomfort at the IV site.  Nausea.  Vomiting.  Sore throat.  Trouble concentrating.  Feeling cold or chills.  Weak or tired.  Sleepiness and fatigue.  Soreness and body aches. These side effects can affect parts of the body that were not involved in surgery.  Follow these instructions at home:    For at least 24 hours after the procedure:  Have a responsible adult stay with you. It is important to have someone help care for you until you are awake and alert.  Rest as needed.  Do not:  Participate in activities in which you could fall or become injured.  Drive.  Use heavy machinery.  Drink alcohol.  Take sleeping pills or medicines that cause drowsiness.  Make important decisions or sign legal documents.  Take care of children on your own.  Eating and drinking  Follow any instructions from your health care provider about eating or drinking restrictions.  When you feel hungry, start by eating small amounts of foods that are soft and easy to digest (bland), such as toast. Gradually return to your regular diet.  Drink enough fluid to keep your urine pale yellow.  If you vomit, rehydrate by drinking water, juice, or clear broth.  General instructions  If you have sleep apnea, surgery and certain medicines can increase your risk for breathing problems. Follow instructions from your health care provider about wearing your sleep device:  Anytime you are sleeping, including during daytime naps.  While taking prescription pain medicines, sleeping medicines, or medicines that make you drowsy.  Return to your normal activities as told by your health care  provider. Ask your health care provider what activities are safe for you.  Take over-the-counter and prescription medicines only as told by your health care provider.  If you smoke, do not smoke without supervision.  Keep all follow-up visits as told by your health care provider. This is important.  Contact a health care provider if:  You have nausea or vomiting that does not get better with medicine.  You cannot eat or drink without vomiting.  You have pain that does not get better with medicine.  You are unable to pass urine.  You develop a skin rash.  You have a fever.  You have redness around your IV site that gets worse.  Get help right away if:  You have difficulty breathing.  You have chest pain.  You have blood in your urine or stool, or you vomit blood.  Summary  After the procedure, it is common to have a sore throat or nausea. It is also common to feel tired.  Have a responsible adult stay with you for the first 24 hours after general anesthesia. It is important to have someone help care for you until you are awake and alert.  When you feel hungry, start by eating small amounts of foods that are soft and easy to digest (bland), such as toast. Gradually return to your regular diet.  Drink enough fluid to keep your urine pale yellow.  Return to your normal activities as told by your health care provider. Ask your health care   provider what activities are safe for you.  This information is not intended to replace advice given to you by your health care provider. Make sure you discuss any questions you have with your health care provider.  Document Released: 11/03/2000 Document Revised: 03/13/2017 Document Reviewed: 03/13/2017  Elsevier Interactive Patient Education  2019 Elsevier Inc.

## 2019-01-22 LAB — NOVEL CORONAVIRUS, NAA (HOSP ORDER, SEND-OUT TO REF LAB; TAT 18-24 HRS): SARS-CoV-2, NAA: NOT DETECTED

## 2019-01-24 ENCOUNTER — Other Ambulatory Visit: Payer: Self-pay

## 2019-01-24 ENCOUNTER — Encounter
Admission: RE | Disposition: A | Discharge status: Home / Self Care | Payer: Self-pay | Source: Home / Self Care | Attending: Gastroenterology

## 2019-01-24 ENCOUNTER — Ambulatory Visit: Payer: Medicare Other | Admitting: Anesthesiology

## 2019-01-24 ENCOUNTER — Ambulatory Visit
Admission: RE | Admit: 2019-01-24 | Discharge: 2019-01-24 | Disposition: A | Discharge status: Home / Self Care | Payer: Medicare Other | Attending: Gastroenterology | Admitting: Gastroenterology

## 2019-01-24 ENCOUNTER — Encounter: Payer: Self-pay | Admitting: Anesthesiology

## 2019-01-24 DIAGNOSIS — K640 First degree hemorrhoids: Secondary | ICD-10-CM | POA: Diagnosis not present

## 2019-01-24 DIAGNOSIS — K219 Gastro-esophageal reflux disease without esophagitis: Secondary | ICD-10-CM | POA: Insufficient documentation

## 2019-01-24 DIAGNOSIS — K635 Polyp of colon: Secondary | ICD-10-CM

## 2019-01-24 DIAGNOSIS — E785 Hyperlipidemia, unspecified: Secondary | ICD-10-CM | POA: Diagnosis not present

## 2019-01-24 DIAGNOSIS — Z1211 Encounter for screening for malignant neoplasm of colon: Secondary | ICD-10-CM | POA: Diagnosis not present

## 2019-01-24 DIAGNOSIS — I4891 Unspecified atrial fibrillation: Secondary | ICD-10-CM | POA: Diagnosis not present

## 2019-01-24 DIAGNOSIS — M1990 Unspecified osteoarthritis, unspecified site: Secondary | ICD-10-CM | POA: Diagnosis not present

## 2019-01-24 DIAGNOSIS — Z79899 Other long term (current) drug therapy: Secondary | ICD-10-CM | POA: Diagnosis not present

## 2019-01-24 DIAGNOSIS — G4730 Sleep apnea, unspecified: Secondary | ICD-10-CM | POA: Insufficient documentation

## 2019-01-24 DIAGNOSIS — Z800 Family history of malignant neoplasm of digestive organs: Secondary | ICD-10-CM | POA: Diagnosis not present

## 2019-01-24 DIAGNOSIS — Z7982 Long term (current) use of aspirin: Secondary | ICD-10-CM | POA: Diagnosis not present

## 2019-01-24 DIAGNOSIS — Z8546 Personal history of malignant neoplasm of prostate: Secondary | ICD-10-CM | POA: Diagnosis not present

## 2019-01-24 DIAGNOSIS — D125 Benign neoplasm of sigmoid colon: Secondary | ICD-10-CM

## 2019-01-24 HISTORY — PX: POLYPECTOMY: SHX5525

## 2019-01-24 HISTORY — PX: COLONOSCOPY WITH PROPOFOL: SHX5780

## 2019-01-24 HISTORY — DX: Gastro-esophageal reflux disease without esophagitis: K21.9

## 2019-01-24 HISTORY — DX: Sciatica, unspecified side: M54.30

## 2019-01-24 HISTORY — DX: Unspecified osteoarthritis, unspecified site: M19.90

## 2019-01-24 SURGERY — COLONOSCOPY WITH PROPOFOL
Anesthesia: General | Site: Rectum

## 2019-01-24 MED ORDER — STERILE WATER FOR IRRIGATION IR SOLN
Status: DC | PRN
  Administered 2019-01-24: 11:00:00 15 mL

## 2019-01-24 MED ORDER — LIDOCAINE HCL (CARDIAC) PF 100 MG/5ML IV SOSY
PREFILLED_SYRINGE | INTRAVENOUS | Status: DC | PRN
  Administered 2019-01-24: 40 mg via INTRAVENOUS

## 2019-01-24 MED ORDER — LACTATED RINGERS IV SOLN: INTRAVENOUS | Status: DC

## 2019-01-24 MED ORDER — ONDANSETRON HCL 4 MG/2ML IJ SOLN: 4.0000 mg | Freq: Once | INTRAMUSCULAR | Status: DC | PRN

## 2019-01-24 MED ORDER — PROPOFOL 10 MG/ML IV BOLUS
INTRAVENOUS | Status: DC | PRN
  Administered 2019-01-24: 20 mg via INTRAVENOUS
  Administered 2019-01-24: 30 mg via INTRAVENOUS
  Administered 2019-01-24: 50 mg via INTRAVENOUS
  Administered 2019-01-24: 30 mg via INTRAVENOUS
  Administered 2019-01-24 (×2): 20 mg via INTRAVENOUS
  Administered 2019-01-24: 100 mg via INTRAVENOUS
  Administered 2019-01-24: 20 mg via INTRAVENOUS
  Administered 2019-01-24: 50 mg via INTRAVENOUS
  Administered 2019-01-24: 30 mg via INTRAVENOUS

## 2019-01-24 MED ORDER — LACTATED RINGERS IV SOLN
INTRAVENOUS | Status: DC | PRN
  Administered 2019-01-24: 11:00:00 via INTRAVENOUS

## 2019-01-24 SURGICAL SUPPLY — 6 items
CANISTER SUCT 1200ML W/VALVE (MISCELLANEOUS) ×3 IMPLANT
FORCEPS BIOP RAD 4 LRG CAP 4 (CUTTING FORCEPS) ×3 IMPLANT
GOWN CVR UNV OPN BCK APRN NK (MISCELLANEOUS) ×2 IMPLANT
GOWN ISOL THUMB LOOP REG UNIV (MISCELLANEOUS) ×4
KIT ENDO PROCEDURE OLY (KITS) ×3 IMPLANT
WATER STERILE IRR 250ML POUR (IV SOLUTION) ×3 IMPLANT

## 2019-01-24 NOTE — Op Note (Signed)
[facility] Patient Name: Darren Tran Procedure Date: 01/24/2019 10:48 AM MRN: [id number] Account #: [id number] Date of Birth: Dec 29, 1945 Admit Type: Outpatient Age: 73 Room: [facility] OR ROOM 01 Gender: Male Note Status: Finalized Procedure:            Colonoscopy Indications:          Family history of colon cancer in a first-degree                        relative before age 59 years Providers:            Lucilla Lame MD, MD Referring MD:         Cletis Athens, MD (Referring MD) Medicines:            Propofol per Anesthesia Complications:        No immediate complications. Procedure:            Pre-Anesthesia Assessment:                       - Prior to the procedure, a History and Physical was                        performed, and patient medications and allergies were                        reviewed. The patient's tolerance of previous                        anesthesia was also reviewed. The risks and benefits of                        the procedure and the sedation options and risks were                        discussed with the patient. All questions were                        answered, and informed consent was obtained. Prior                        Anticoagulants: The patient has taken no previous                        anticoagulant or antiplatelet agents. ASA Grade                        Assessment: II - A patient with mild systemic disease.                        After reviewing the risks and benefits, the patient was                        deemed in satisfactory condition to undergo the                        procedure.                       After obtaining informed consent, the colonoscope was  passed under direct vision. Throughout the procedure,                        the patient's blood pressure, pulse, and oxygen                        saturations were monitored continuously. The was   introduced through the anus and advanced to the the                        cecum, identified by appendiceal orifice and ileocecal                        valve. The colonoscopy was performed without                        difficulty. The patient tolerated the procedure well.                        The quality of the bowel preparation was excellent. Findings:      The perianal and digital rectal examinations were normal.      Two sessile polyps were found in the sigmoid colon. The polyps were 2 to       3 mm in size. These polyps were removed with a cold biopsy forceps.       Resection and retrieval were complete.      Non-bleeding internal hemorrhoids were found during retroflexion. The       hemorrhoids were Grade I (internal hemorrhoids that do not prolapse). Impression:           - Two 2 to 3 mm polyps in the sigmoid colon, removed                        with a cold biopsy forceps. Resected and retrieved.                       - Non-bleeding internal hemorrhoids. Recommendation:       - Discharge patient to home.                       - Resume previous diet.                       - Continue present medications.                       - Repeat colonoscopy in 5 years for surveillance. Procedure Code(s):    --- Professional ---                       [phone], Colonoscopy, flexible; with biopsy, single or                        multiple Diagnosis Code(s):    --- Professional ---                       Z80.0, Family history of malignant neoplasm of                        digestive organs  K63.5, Polyp of colon CPT copyright 2019 American Medical Association. All rights reserved. The codes documented in this report are preliminary and upon coder review may  be revised to meet current compliance requirements. Lucilla Lame MD, MD 01/24/2019 11:30:23 AM This report has been signed electronically. Number of Addenda: 0 Note Initiated On: 01/24/2019 10:48 AM Scope Withdrawal  Time: 0 hours 9 minutes 33 seconds  Total Procedure Duration: 0 hours 12 minutes 21 seconds  Estimated Blood Loss: Estimated blood loss: none.      [facility]

## 2019-01-24 NOTE — Transfer of Care (Signed)
Immediate Anesthesia Transfer of Care Note  Patient: Darren COLLARD Sr.  Procedure(s) Performed: COLONOSCOPY WITH BIOPSY (N/A Rectum) POLYPECTOMY (N/A Rectum)  Patient Location: PACU  Anesthesia Type: General  Level of Consciousness: awake, alert  and patient cooperative  Airway and Oxygen Therapy: Patient Spontanous Breathing and Patient connected to supplemental oxygen  Post-op Assessment: Post-op Vital signs reviewed, Patient's Cardiovascular Status Stable, Respiratory Function Stable, Patent Airway and No signs of Nausea or vomiting  Post-op Vital Signs: Reviewed and stable  Complications: No apparent anesthesia complications

## 2019-01-24 NOTE — Anesthesia Postprocedure Evaluation (Signed)
Anesthesia Post Note  Patient: Darren ZEITLIN Sr.  Procedure(s) Performed: COLONOSCOPY WITH BIOPSY (N/A Rectum) POLYPECTOMY (N/A Rectum)  Patient location during evaluation: PACU Anesthesia Type: General Level of consciousness: awake Pain management: pain level controlled Vital Signs Assessment: post-procedure vital signs reviewed and stable Respiratory status: respiratory function stable Cardiovascular status: stable Postop Assessment: no signs of nausea or vomiting Anesthetic complications: no    Veda Canning

## 2019-01-24 NOTE — Anesthesia Preprocedure Evaluation (Signed)
Anesthesia Evaluation  Patient identified by MRN, date of birth, ID band Patient awake    Reviewed: Allergy & Precautions, NPO status , Patient's Chart, lab work & pertinent test results  Airway Mallampati: II  TM Distance: >3 FB     Dental   Pulmonary sleep apnea ,    breath sounds clear to auscultation       Cardiovascular hypertension, + dysrhythmias Atrial Fibrillation  Rhythm:Regular Rate:Normal  HLD   Neuro/Psych    GI/Hepatic GERD  ,  Endo/Other    Renal/GU      Musculoskeletal  (+) Arthritis ,   Abdominal   Peds  Hematology   Anesthesia Other Findings   Reproductive/Obstetrics                             Anesthesia Physical Anesthesia Plan  ASA: III  Anesthesia Plan: General   Post-op Pain Management:    Induction: Intravenous  PONV Risk Score and Plan:   Airway Management Planned: Nasal Cannula and Natural Airway  Additional Equipment:   Intra-op Plan:   Post-operative Plan:   Informed Consent: I have reviewed the patients History and Physical, chart, labs and discussed the procedure including the risks, benefits and alternatives for the proposed anesthesia with the patient or authorized representative who has indicated his/her understanding and acceptance.       Plan Discussed with: CRNA  Anesthesia Plan Comments:         Anesthesia Quick Evaluation

## 2019-01-24 NOTE — H&P (Signed)
Darren Lame, MD [address] [place], [place] [address] Phone:[phone] Fax : [phone]  Primary Care Physician:  Cletis Athens, MD Primary Gastroenterologist:  Dr. Allen Norris  Pre-Procedure History & Physical: HPI:  Darren WESTRUP Sr. is a 73 y.o. male is here for an colonoscopy.   Past Medical History:  Diagnosis Date  . Arthritis   . Atrial fibrillation ([place])   . GERD (gastroesophageal reflux disease)   . History of prostate cancer   . Hyperlipidemia 09/09/2018  . Personal history of prostate cancer 08/26/2017  . Sciatica    bilateral, intermittent  . Sleep apnea    CPAP    Past Surgical History:  Procedure Laterality Date  . KNEE ARTHROSCOPY    . NOSE SURGERY    . PROSTATECTOMY    . SHOULDER ARTHROSCOPY    . WISDOM TOOTH EXTRACTION      Prior to Admission medications   Medication Sig Start Date End Date Taking? Authorizing Provider  Ascorbic Acid (VITAMIN C PO) Take by mouth daily.   Yes [provider]  ASPIRIN 81 PO Take by mouth daily.   Yes [provider]  atenolol (TENORMIN) 50 MG tablet  06/29/17  Yes [provider]  citalopram (CELEXA) 20 MG tablet Take by mouth. 09/27/09  Yes [provider]  fenofibrate 160 MG tablet Take by mouth every other day.  06/07/09  Yes [provider]  fesoterodine (TOVIAZ) 4 MG TB24 tablet Take 4 mg by mouth every other day.   Yes [provider]  Melatonin 5 MG TABS Take by mouth at bedtime as needed.   Yes [provider]  mometasone (NASONEX) 50 MCG/ACT nasal spray daily as needed.  09/01/18  Yes [provider]  Multiple Vitamin (MULTIVITAMIN) capsule Take by mouth.   Yes [provider]  nitroGLYCERIN (NITROSTAT) 0.3 MG SL tablet PLACE 1 (ONE) TABLET SUBLINGUALLY (UNDER TONGUE) AS NEEDED 10/19/15  Yes [provider]  omeprazole (PRILOSEC) 40 MG capsule Take 40 mg by mouth daily as needed.    Yes [provider]   prazosin (MINIPRESS) 1 MG capsule Take 3 mg by mouth at bedtime.    Yes [provider]  tretinoin (RETIN-A) 0.05 % cream  09/03/18  Yes [provider]  mirabegron ER (MYRBETRIQ) 50 MG TB24 tablet Take 1 tablet (50 mg total) by mouth daily. Patient not taking: Reported on 01/18/2019 09/09/18   Abbie Sons, MD  zolpidem (AMBIEN) 5 MG tablet  08/13/17   [provider]    Allergies as of 11/16/2018  . (No Known Allergies)    Family History  Problem Relation Age of Onset  . Prostate cancer Neg Hx   . Bladder Cancer Neg Hx   . Kidney cancer Neg Hx     Social History   Socioeconomic History  . Marital status: Married    Spouse name: Not on file  . Number of children: Not on file  . Years of education: Not on file  . Highest education level: Not on file  Occupational History  . Not on file  Social Needs  . Financial resource strain: Not on file  . Food insecurity    Worry: Not on file    Inability: Not on file  . Transportation needs    Medical: Not on file    Non-medical: Not on file  Tobacco Use  . Smoking status: Never Smoker  . Smokeless tobacco: Never Used  Substance and Sexual Activity  .  Alcohol use: No    Frequency: Never    Comment: may have drink 5x/yr  . Drug use: No  . Sexual activity: Not on file  Lifestyle  . Physical activity    Days per week: Not on file    Minutes per session: Not on file  . Stress: Not on file  Relationships  . Social Herbalist on phone: Not on file    Gets together: Not on file    Attends religious service: Not on file    Active member of club or organization: Not on file    Attends meetings of clubs or organizations: Not on file    Relationship status: Not on file  . Intimate partner violence    Fear of current or ex partner: Not on file    Emotionally abused: Not on file    Physically abused: Not on file    Forced sexual activity: Not on file  Other Topics Concern  . Not on file   Social History Narrative  . Not on file    Review of Systems: See HPI, otherwise negative ROS  Physical Exam: Ht 6' (1.829 m)   Wt 106.6 kg   BMI 31.87 kg/m  General:   Alert,  pleasant and cooperative in NAD Head:  Normocephalic and atraumatic. Neck:  Supple; no masses or thyromegaly. Lungs:  Clear throughout to auscultation.    Heart:  Regular rate and rhythm. Abdomen:  Soft, nontender and nondistended. Normal bowel sounds, without guarding, and without rebound.   Neurologic:  Alert and  oriented x4;  grossly normal neurologically.  Impression/Plan: Darren Alu Sr. is here for an colonoscopy to be performed for family histroy of colon cancer.  Risks, benefits, limitations, and alternatives regarding  colonoscopy have been reviewed with the patient.  Questions have been answered.  All parties agreeable.   Darren Lame, MD  01/24/2019, 10:56 AM

## 2019-01-24 NOTE — Anesthesia Procedure Notes (Signed)
Procedure Name: MAC Date/Time: 01/24/2019 11:11 AM Performed by: Janna Arch, CRNA Pre-anesthesia Checklist: Patient identified, Emergency Drugs available, Suction available, Timeout performed and Patient being monitored Patient Re-evaluated:Patient Re-evaluated prior to induction Oxygen Delivery Method: Nasal cannula Placement Confirmation: positive ETCO2

## 2019-01-25 ENCOUNTER — Encounter: Payer: Self-pay | Admitting: Gastroenterology

## 2019-01-26 ENCOUNTER — Encounter: Payer: Self-pay | Admitting: Gastroenterology

## 2019-01-27 ENCOUNTER — Encounter: Payer: Self-pay | Admitting: Gastroenterology

## 2019-05-13 ENCOUNTER — Other Ambulatory Visit: Payer: Self-pay | Admitting: Internal Medicine

## 2019-05-13 ENCOUNTER — Ambulatory Visit
Admission: RE | Admit: 2019-05-13 | Discharge: 2019-05-13 | Disposition: A | Discharge status: Home / Self Care | Payer: Medicare Other | Attending: Internal Medicine | Admitting: Internal Medicine

## 2019-05-13 ENCOUNTER — Ambulatory Visit
Admission: RE | Admit: 2019-05-13 | Discharge: 2019-05-13 | Disposition: A | Discharge status: Home / Self Care | Payer: Medicare Other | Source: Ambulatory Visit | Attending: Internal Medicine | Admitting: Internal Medicine

## 2019-05-13 DIAGNOSIS — Z77090 Contact with and (suspected) exposure to asbestos: Secondary | ICD-10-CM

## 2019-05-13 DIAGNOSIS — I10 Essential (primary) hypertension: Secondary | ICD-10-CM | POA: Diagnosis present

## 2019-05-13 DIAGNOSIS — I4891 Unspecified atrial fibrillation: Secondary | ICD-10-CM | POA: Insufficient documentation

## 2019-08-17 ENCOUNTER — Other Ambulatory Visit: Payer: Medicare Other

## 2019-08-17 ENCOUNTER — Ambulatory Visit: Payer: Medicare Other | Attending: Internal Medicine

## 2019-08-17 DIAGNOSIS — Z20822 Contact with and (suspected) exposure to covid-19: Secondary | ICD-10-CM

## 2019-08-19 LAB — NOVEL CORONAVIRUS, NAA: SARS-CoV-2, NAA: NOT DETECTED

## 2019-08-21 ENCOUNTER — Ambulatory Visit: Payer: Medicare Other | Attending: Internal Medicine

## 2019-08-21 DIAGNOSIS — Z23 Encounter for immunization: Secondary | ICD-10-CM

## 2019-08-21 NOTE — Progress Notes (Signed)
   [id number]  Name:  Darren CABRERO Sr.    MRN: [id number] DOB: 11-Nov-1945  08/21/2019  Darren Tran was observed post Covid-19 immunization for 15 minutes without incidence. He was provided with Vaccine Information Sheet and instruction to access the V-Safe system.   Darren Tran was instructed to call 911 with any severe reactions post vaccine: Marland Kitchen Difficulty breathing  . Swelling of your face and throat  . A fast heartbeat  . A bad rash all over your body  . Dizziness and weakness    Immunizations Administered    Name Date Dose VIS Date Route   Pfizer COVID-19 Vaccine 08/21/2019  2:04 PM 0.3 mL 07/22/2019 Intramuscular   Manufacturer: [place]   Lot: [id number]   [place]: [id number]

## 2019-09-01 DIAGNOSIS — M461 Sacroiliitis, not elsewhere classified: Secondary | ICD-10-CM | POA: Insufficient documentation

## 2019-09-05 ENCOUNTER — Other Ambulatory Visit: Payer: Self-pay | Admitting: *Deleted

## 2019-09-05 DIAGNOSIS — Z8546 Personal history of malignant neoplasm of prostate: Secondary | ICD-10-CM

## 2019-09-06 ENCOUNTER — Other Ambulatory Visit: Payer: Self-pay

## 2019-09-06 ENCOUNTER — Other Ambulatory Visit: Payer: Medicare Other

## 2019-09-09 ENCOUNTER — Other Ambulatory Visit: Payer: Self-pay

## 2019-09-09 ENCOUNTER — Ambulatory Visit: Payer: Medicare Other | Admitting: Urology

## 2019-09-09 ENCOUNTER — Encounter: Payer: Self-pay | Admitting: Urology

## 2019-09-09 VITALS — BP 121/70 | HR 57 | Ht 72.0 in | Wt 242.0 lb

## 2019-09-09 DIAGNOSIS — N529 Male erectile dysfunction, unspecified: Secondary | ICD-10-CM | POA: Diagnosis not present

## 2019-09-09 DIAGNOSIS — Z8546 Personal history of malignant neoplasm of prostate: Secondary | ICD-10-CM | POA: Diagnosis not present

## 2019-09-09 DIAGNOSIS — N3281 Overactive bladder: Secondary | ICD-10-CM | POA: Diagnosis not present

## 2019-09-09 NOTE — Progress Notes (Addendum)
09/09/2019 9:31 AM   Darren Alu Sr. 1946-06-06 [id number]  Referring provider: Cletis Athens, MD [address] [place],  [place] [address]  Chief Complaint  Patient presents with  . Follow-up    Urologic History: 1. Prostate Cancer             - Radical prostatectomy at [facility] in 08/2003             - PSA remains undetectable; Patient elected to continue PSA surveillance              2. Urinary Frequency/Urgency             - Toviaz 4 mg  3. ED             - Previously on Edex             - Not sexually active at this time    HPI: 74 y.o. male presents for annual follow-up.  He remains on Toviaz 4 mg every other day.  His symptoms have persisted but are tolerable.  He has urinary frequency, urgency and occasional intermittent stream and straining to urinate.  He has tried Myrbetriq without efficacy.  He does have dry mouth and constipation with Toviaz.  PSA was drawn with his PCP September 2020 and remains undetectable at <0.1.  He is no longer performing intracavernosal injections.  He inquired if there are any new treatments for ED.  He does note occasional scrotal discomfort and thinks it is related to prior episodes of scrotal trauma during sports.   PMH: Past Medical History:  Diagnosis Date  . Arthritis   . Atrial fibrillation ([place])   . GERD (gastroesophageal reflux disease)   . History of prostate cancer   . Hyperlipidemia 09/09/2018  . Personal history of prostate cancer 08/26/2017  . Sciatica    bilateral, intermittent  . Sleep apnea    CPAP    Surgical History: Past Surgical History:  Procedure Laterality Date  . COLONOSCOPY WITH PROPOFOL N/A 01/24/2019   Procedure: COLONOSCOPY WITH BIOPSY;  Surgeon: Lucilla Lame, MD;  Location: [facility];  Service: Endoscopy;  Laterality: N/A;  sleep apnea  . KNEE ARTHROSCOPY    . NOSE SURGERY    . POLYPECTOMY N/A 01/24/2019   Procedure: POLYPECTOMY;  Surgeon: Lucilla Lame, MD;  Location: [facility];  Service: Endoscopy;  Laterality: N/A;  . PROSTATECTOMY    . SHOULDER ARTHROSCOPY    . WISDOM TOOTH EXTRACTION      Home Medications:  Allergies as of 09/09/2019   No Known Allergies     Medication List       Accurate as of September 09, 2019 11:59 PM. If you have any questions, ask your nurse or doctor.        STOP taking these medications   mirabegron ER 50 MG Tb24 tablet Commonly known as: MYRBETRIQ Stopped by: Abbie Sons, MD     TAKE these medications   ASPIRIN 81 PO Take by mouth daily.   atenolol 50 MG tablet Commonly known as: TENORMIN   CENTRUM SILVER 50+MEN PO Take by mouth.   Cholecalciferol 25 MCG (1000 UT) tablet Take by mouth.   citalopram 20 MG tablet Commonly known as: CELEXA Take by mouth.   fenofibrate 160 MG tablet Take by mouth every other day.   loratadine 10 MG tablet Commonly known as: CLARITIN Take 10 mg by mouth daily.   Melatonin 5 MG Tabs Take by mouth at bedtime  as needed.   mometasone 50 MCG/ACT nasal spray Commonly known as: NASONEX daily as needed.   multivitamin capsule Take by mouth.   nitroGLYCERIN 0.3 MG SL tablet Commonly known as: NITROSTAT PLACE 1 (ONE) TABLET SUBLINGUALLY (UNDER TONGUE) AS NEEDED   omeprazole 40 MG capsule Commonly known as: PRILOSEC Take 40 mg by mouth daily as needed.   OSTEO BI-FLEX REGULAR STRENGTH PO Take by mouth 2 (two) times daily.   prazosin 1 MG capsule Commonly known as: MINIPRESS Take 3 mg by mouth at bedtime.   Toviaz 4 MG Tb24 tablet Generic drug: fesoterodine Take 4 mg by mouth every other day.   tretinoin 0.05 % cream Commonly known as: RETIN-A   VITAMIN C PO Take by mouth daily.   zolpidem 5 MG tablet Commonly known as: AMBIEN       Allergies: No Known Allergies  Family History: Family History  Problem Relation Age of Onset  . Prostate cancer Neg Hx   . Bladder Cancer Neg Hx   . Kidney cancer Neg Hx     Social History:  reports that he has  never smoked. He has never used smokeless tobacco. He reports that he does not drink alcohol or use drugs.  ROS: UROLOGY Frequent Urination?: Yes Hard to postpone urination?: Yes Burning/pain with urination?: No Get up at night to urinate?: No Leakage of urine?: No Urine stream starts and stops?: Yes Trouble starting stream?: No Do you have to strain to urinate?: Yes Blood in urine?: No Urinary tract infection?: No Sexually transmitted disease?: No Injury to kidneys or bladder?: No Painful intercourse?: No Weak stream?: No Erection problems?: No Penile pain?: No  Gastrointestinal Nausea?: No Vomiting?: No Indigestion/heartburn?: No Diarrhea?: No Constipation?: No  Constitutional Fever: No Night sweats?: No Weight loss?: No Fatigue?: No  Skin Skin rash/lesions?: No Itching?: No  Eyes Blurred vision?: No Double vision?: No  Ears/Nose/Throat Sore throat?: No Sinus problems?: Yes  Hematologic/Lymphatic Swollen glands?: No Easy bruising?: No  Cardiovascular Leg swelling?: No Chest pain?: No  Respiratory Cough?: No Shortness of breath?: No  Endocrine Excessive thirst?: No  Musculoskeletal Back pain?: Yes Joint pain?: Yes  Neurological Headaches?: No Dizziness?: No  Psychologic Depression?: No Anxiety?: No  Physical Exam: BP 121/70 (BP Location: Left Arm, Patient Position: Sitting, Cuff Size: Large)   Pulse (!) 57   Ht 6' (1.829 m)   Wt 242 lb (109.8 kg)   BMI 32.82 kg/m   Constitutional:  Alert and oriented, No acute distress. HEENT: [place] AT, moist mucus membranes.  Trachea midline, no masses. Cardiovascular: No clubbing, cyanosis, or edema. Respiratory: Normal respiratory effort, no increased work of breathing. GU: Phallus without lesions, testes slightly atrophic, no masses or significant tenderness. Skin: No rashes, bruises or suspicious lesions. Neurologic: Grossly intact, no focal deficits, moving all 4 extremities. Psychiatric:  Normal mood and affect.   Assessment & Plan:    - Lower urinary tract symptoms Most bothersome symptoms are storage related.  No improvement with previous trials of Myrbetriq.  He has elected to continue [place].  - Erectile dysfunction We discussed the only your treatment is shockwave therapy however unlikely to be effective with post prostatectomy ED and is expensive.  I again discussed penile implant surgery and if he is interested in referral will call back.  - History prostate cancer PSA remains undetectable.  Abbie Sons, [place] [address] [place], [place] [address] [phone]

## 2019-09-11 ENCOUNTER — Ambulatory Visit: Payer: Medicare Other | Attending: Internal Medicine

## 2019-09-11 DIAGNOSIS — Z23 Encounter for immunization: Secondary | ICD-10-CM | POA: Insufficient documentation

## 2019-09-11 NOTE — Progress Notes (Signed)
   [id number] Vaccination Clinic  Name:  JAISHAWN BUFFONE Sr.    MRN: [id number] DOB: Jan 24, 1946  09/11/2019  Mr. Herzing was observed post Covid-19 immunization for 15 minutes without incidence. He was provided with Vaccine Information Sheet and instruction to access the V-Safe system.   Mr. Heyser was instructed to call 911 with any severe reactions post vaccine: Marland Kitchen Difficulty breathing  . Swelling of your face and throat  . A fast heartbeat  . A bad rash all over your body  . Dizziness and weakness    Immunizations Administered    Name Date Dose VIS Date Route   Pfizer COVID-19 Vaccine 09/11/2019 12:56 PM 0.3 mL 07/22/2019 Intramuscular   Manufacturer: [place]   Lot: [id number]   [place]: [id number]

## 2019-12-30 ENCOUNTER — Ambulatory Visit: Payer: Medicare Other | Admitting: Internal Medicine

## 2019-12-30 ENCOUNTER — Other Ambulatory Visit: Payer: Self-pay

## 2019-12-30 ENCOUNTER — Encounter: Payer: Self-pay | Admitting: Internal Medicine

## 2019-12-30 VITALS — BP 116/64 | HR 81 | Wt 236.8 lb

## 2019-12-30 DIAGNOSIS — I10 Essential (primary) hypertension: Secondary | ICD-10-CM | POA: Diagnosis not present

## 2019-12-30 DIAGNOSIS — M461 Sacroiliitis, not elsewhere classified: Secondary | ICD-10-CM

## 2019-12-30 DIAGNOSIS — D125 Benign neoplasm of sigmoid colon: Secondary | ICD-10-CM

## 2019-12-30 DIAGNOSIS — J301 Allergic rhinitis due to pollen: Secondary | ICD-10-CM | POA: Diagnosis not present

## 2019-12-30 MED ORDER — LORATADINE 10 MG PO TABS: 10.0000 mg | ORAL_TABLET | Freq: Every day | ORAL | 5 refills | Status: DC

## 2019-12-30 MED ORDER — FLUTICASONE PROPIONATE 50 MCG/ACT NA SUSP: 2.0000 | Freq: Every day | NASAL | 6 refills | Status: DC

## 2019-12-30 NOTE — Progress Notes (Signed)
Established Patient Office Visit  Subjective:  Patient ID: Darren HOUTZ Sr., male    DOB: [birth year]  Age: 74 y.o. MRN: [id number]  CC:  Chief Complaint  Patient presents with  . URI    patient complains of fatigue, sore throat, cough, congestion    HPI  Darren AUDET Sr. presents fo with wheezing shortness of breath cough and upper respiratory infection ,there is no history of fever chill headache myalgia, he has been working outside.  Denies any history of chest pain or swelling of the leg.  He has not been exposed to anybody with Covid.  Past Medical History:  Diagnosis Date  . Arthritis   . Atrial fibrillation ([place])   . GERD (gastroesophageal reflux disease)   . History of prostate cancer   . Hyperlipidemia 09/09/2018  . Personal history of prostate cancer 08/26/2017  . Sciatica    bilateral, intermittent  . Sleep apnea    CPAP    Past Surgical History:  Procedure Laterality Date  . COLONOSCOPY WITH PROPOFOL N/A 01/24/2019   Procedure: COLONOSCOPY WITH BIOPSY;  Surgeon: Lucilla Lame, MD;  Location: [place];  Service: Endoscopy;  Laterality: N/A;  sleep apnea  . KNEE ARTHROSCOPY    . NOSE SURGERY    . POLYPECTOMY N/A 01/24/2019   Procedure: POLYPECTOMY;  Surgeon: Lucilla Lame, MD;  Location: [place];  Service: Endoscopy;  Laterality: N/A;  . PROSTATECTOMY    . SHOULDER ARTHROSCOPY    . WISDOM TOOTH EXTRACTION      Family History  Problem Relation Age of Onset  . Prostate cancer Neg Hx   . Bladder Cancer Neg Hx   . Kidney cancer Neg Hx     Social History   Socioeconomic History  . Marital status: Married    Spouse name: Not on file  . Number of children: Not on file  . Years of education: Not on file  . Highest education level: Not on file  Occupational History  . Not on file  Tobacco Use  . Smoking status: Never Smoker  . Smokeless tobacco: Never Used  Substance and Sexual Activity  . Alcohol use: No    Comment: may have  drink 5x/yr  . Drug use: No  . Sexual activity: Yes    Birth control/protection: None  Other Topics Concern  . Not on file  Social History Narrative  . Not on file   Social Determinants of Health   Financial Resource Strain:   . Difficulty of Paying Living Expenses:   Food Insecurity:   . Worried About Charity fundraiser in the Last Year:   . Arboriculturist in the Last Year:   Transportation Needs:   . Film/video editor (Medical):   Marland Kitchen Lack of Transportation (Non-Medical):   Physical Activity:   . Days of Exercise per Week:   . Minutes of Exercise per Session:   Stress:   . Feeling of Stress :   Social Connections:   . Frequency of Communication with Friends and Family:   . Frequency of Social Gatherings with Friends and Family:   . Attends Religious Services:   . Active Member of Clubs or Organizations:   . Attends Archivist Meetings:   Marland Kitchen Marital Status:   Intimate Partner Violence:   . Fear of Current or Ex-Partner:   . Emotionally Abused:   Marland Kitchen Physically Abused:   . Sexually Abused:      Current Outpatient Medications:  .  Ascorbic Acid (VITAMIN C PO), Take by mouth daily., Disp: , Rfl:  .  ASPIRIN 81 PO, Take by mouth daily., Disp: , Rfl:  .  atenolol (TENORMIN) 50 MG tablet, , Disp: , Rfl:  .  Cholecalciferol 25 MCG (1000 UT) tablet, Take by mouth., Disp: , Rfl:  .  citalopram (CELEXA) 20 MG tablet, Take by mouth., Disp: , Rfl:  .  fenofibrate 160 MG tablet, Take by mouth every other day. , Disp: , Rfl:  .  fesoterodine (TOVIAZ) 4 MG TB24 tablet, Take 4 mg by mouth every other day., Disp: , Rfl:  .  Glucosamine-Chondroitin (OSTEO BI-FLEX REGULAR STRENGTH PO), Take by mouth 2 (two) times daily., Disp: , Rfl:  .  loratadine (CLARITIN) 10 MG tablet, Take 1 tablet (10 mg total) by mouth daily., Disp: 30 tablet, Rfl: 5 .  Melatonin 5 MG TABS, Take by mouth at bedtime as needed., Disp: , Rfl:  .  mometasone (NASONEX) 50 MCG/ACT nasal spray, daily as  needed. , Disp: , Rfl:  .  Multiple Vitamin (MULTIVITAMIN) capsule, Take by mouth., Disp: , Rfl:  .  nitroGLYCERIN (NITROSTAT) 0.3 MG SL tablet, PLACE 1 (ONE) TABLET SUBLINGUALLY (UNDER TONGUE) AS NEEDED, Disp: , Rfl:  .  omeprazole (PRILOSEC) 40 MG capsule, Take 40 mg by mouth daily as needed. , Disp: , Rfl:  .  prazosin (MINIPRESS) 1 MG capsule, Take 3 mg by mouth at bedtime. , Disp: , Rfl:  .  tretinoin (RETIN-A) 0.05 % cream, , Disp: , Rfl:  .  zolpidem (AMBIEN) 5 MG tablet, , Disp: , Rfl:  .  fluticasone (FLONASE) 50 MCG/ACT nasal spray, Place 2 sprays into both nostrils daily., Disp: 16 g, Rfl: 6   No Known Allergies  ROS Review of Systems  Constitutional: Negative.  Negative for appetite change.  HENT: Positive for congestion, rhinorrhea, sneezing, sore throat and trouble swallowing. Negative for mouth sores.   Eyes: Negative for pain.  Respiratory: Positive for cough and wheezing.   Cardiovascular: Negative.  Negative for chest pain.  Gastrointestinal: Negative.   Endocrine: Negative for heat intolerance.  Genitourinary: Negative for difficulty urinating.  Musculoskeletal: Negative for back pain.  Neurological: Negative for dizziness and light-headedness.  Psychiatric/Behavioral: Negative for confusion.      Objective:    Physical Exam  Constitutional: He appears well-developed and well-nourished.  HENT:  Head: Normocephalic and atraumatic.  Neck: No JVD present.  Musculoskeletal:        General: No edema.     Cervical back: Neck supple.  Lymphadenopathy:    He has no cervical adenopathy.  Neurological: He is alert.  Skin: Skin is warm.    BP 116/64   Pulse 81   Wt 236 lb 12.8 oz (107.4 kg)   BMI 32.12 kg/m  Wt Readings from Last 3 Encounters:  12/30/19 236 lb 12.8 oz (107.4 kg)  09/09/19 242 lb (109.8 kg)  01/24/19 231 lb (104.8 kg)     Health Maintenance Due  Topic Date Due  . Hepatitis C Screening  Never done  . TETANUS/TDAP  Never done  . PNA vac  Low Risk Adult (1 of 2 - PCV13) Never done    There are no preventive care reminders to display for this patient.  Lab Results  Component Value Date   TSH 2.625 03/11/2018   Lab Results  Component Value Date   WBC 4.9 04/29/2018   HGB 13.6 06/01/2018   HCT 38.4 04/29/2018   MCV 94 04/29/2018  PLT 186 04/29/2018   Lab Results  Component Value Date   NA 143 04/29/2018   K 4.7 04/29/2018   CO2 23 04/29/2018   GLUCOSE 90 04/29/2018   BUN 21 04/29/2018   CREATININE 1.05 04/29/2018   BILITOT 0.4 04/29/2018   ALKPHOS 64 04/29/2018   AST 15 04/29/2018   ALT 19 04/29/2018   PROT 6.6 04/29/2018   ALBUMIN 4.7 04/29/2018   CALCIUM 9.4 04/29/2018   ANIONGAP 6 03/11/2018   No results found for: CHOL No results found for: HDL No results found for: LDLCALC No results found for: TRIG No results found for: CHOLHDL Lab Results  Component Value Date   HGBA1C 5.7 (H) 03/11/2018      Assessment & Plan:   Problem List Items Addressed This Visit      Cardiovascular and Mediastinum   Hypertension    Stable.        Respiratory   Seasonal allergic rhinitis due to pollen - Primary    Due to pollen allergy.      Relevant Medications   loratadine (CLARITIN) 10 MG tablet   fluticasone (FLONASE) 50 MCG/ACT nasal spray     Digestive   Polyp of sigmoid colon    Ref to gi        Musculoskeletal and Integument   Sacroiliitis ([facility])    better         Meds ordered this encounter  Medications  . loratadine (CLARITIN) 10 MG tablet    Sig: Take 1 tablet (10 mg total) by mouth daily.    Dispense:  30 tablet    Refill:  5  . fluticasone (FLONASE) 50 MCG/ACT nasal spray    Sig: Place 2 sprays into both nostrils daily.    Dispense:  16 g    Refill:  6   1. Essential hypertension Stable.  2. Seasonal allergic rhinitis due to pollen Use Flonase and loratadine. - loratadine (CLARITIN) 10 MG tablet; Take 1 tablet (10 mg total) by mouth daily.  Dispense: 30 tablet; Refill:  5 - fluticasone (FLONASE) 50 MCG/ACT nasal spray; Place 2 sprays into both nostrils daily.  Dispense: 16 g; Refill: 6  3. 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  He received a shot for the sacroiliac joint and is getting better.  4. Adenomatous polyp of sigmoid colon Follow-up with GI Follow-up: Return in about 3 months (around 03/31/2020).    Cletis Athens, MD

## 2019-12-30 NOTE — Assessment & Plan Note (Signed)
Stable

## 2019-12-30 NOTE — Assessment & Plan Note (Signed)
Ref to gi 

## 2019-12-30 NOTE — Assessment & Plan Note (Signed)
better 

## 2019-12-30 NOTE — Assessment & Plan Note (Signed)
Due to pollen allergy.

## 2020-02-01 ENCOUNTER — Other Ambulatory Visit: Payer: Self-pay | Admitting: Internal Medicine

## 2020-04-26 ENCOUNTER — Other Ambulatory Visit: Payer: Self-pay | Admitting: Internal Medicine

## 2020-06-06 ENCOUNTER — Other Ambulatory Visit: Payer: Self-pay

## 2020-06-06 ENCOUNTER — Ambulatory Visit (INDEPENDENT_AMBULATORY_CARE_PROVIDER_SITE_OTHER): Payer: Medicare Other | Admitting: Internal Medicine

## 2020-06-06 DIAGNOSIS — Z23 Encounter for immunization: Secondary | ICD-10-CM | POA: Diagnosis not present

## 2020-06-12 DIAGNOSIS — M47816 Spondylosis without myelopathy or radiculopathy, lumbar region: Secondary | ICD-10-CM | POA: Insufficient documentation

## 2020-06-18 ENCOUNTER — Encounter: Payer: Self-pay | Admitting: Internal Medicine

## 2020-06-18 ENCOUNTER — Ambulatory Visit (INDEPENDENT_AMBULATORY_CARE_PROVIDER_SITE_OTHER): Payer: Medicare Other | Admitting: Internal Medicine

## 2020-06-18 ENCOUNTER — Other Ambulatory Visit: Payer: Self-pay

## 2020-06-18 VITALS — BP 123/74 | HR 64 | Ht 72.0 in | Wt 236.5 lb

## 2020-06-18 DIAGNOSIS — M720 Palmar fascial fibromatosis [Dupuytren]: Secondary | ICD-10-CM

## 2020-06-18 DIAGNOSIS — I10 Essential (primary) hypertension: Secondary | ICD-10-CM

## 2020-06-18 DIAGNOSIS — I2510 Atherosclerotic heart disease of native coronary artery without angina pectoris: Secondary | ICD-10-CM | POA: Diagnosis not present

## 2020-06-18 DIAGNOSIS — Z23 Encounter for immunization: Secondary | ICD-10-CM

## 2020-06-18 DIAGNOSIS — J301 Allergic rhinitis due to pollen: Secondary | ICD-10-CM

## 2020-06-18 DIAGNOSIS — Z0000 Encounter for general adult medical examination without abnormal findings: Secondary | ICD-10-CM | POA: Diagnosis not present

## 2020-06-18 DIAGNOSIS — N3281 Overactive bladder: Secondary | ICD-10-CM

## 2020-06-18 MED ORDER — ROSUVASTATIN CALCIUM 10 MG PO TABS: 10.0000 mg | ORAL_TABLET | Freq: Every day | ORAL | 3 refills | Status: DC

## 2020-06-18 NOTE — Progress Notes (Signed)
Established Patient Office Visit  Subjective:  Patient ID: Darren BLEDSOE Sr., male    DOB: 27-May-1946  Age: 74 y.o. MRN: [id number]  CC:  Chief Complaint  Patient presents with  . Annual Exam    HPI  Darren BORAK Sr. presents for physical evaluation.  Patient is known to have hypertension allergic rhinitis sacroiliitis also has overactive bladder.  His blood pressure is under control at the present time.  He denies any chest pain or shortness of breath.  Patient has a sleep apnea has a history of atrial fibrillation and reflux problem.  He also uses CPAP for sleep apnea Past Medical History:  Diagnosis Date  . Arthritis   . Atrial fibrillation ([place])   . GERD (gastroesophageal reflux disease)   . History of prostate cancer   . Hyperlipidemia 09/09/2018  . Personal history of prostate cancer 08/26/2017  . Sciatica    bilateral, intermittent  . Sleep apnea    CPAP    Past Surgical History:  Procedure Laterality Date  . COLONOSCOPY WITH PROPOFOL N/A 01/24/2019   Procedure: COLONOSCOPY WITH BIOPSY;  Surgeon: Lucilla Lame, MD;  Location: [place];  Service: Endoscopy;  Laterality: N/A;  sleep apnea  . KNEE ARTHROSCOPY    . NOSE SURGERY    . POLYPECTOMY N/A 01/24/2019   Procedure: POLYPECTOMY;  Surgeon: Lucilla Lame, MD;  Location: [place];  Service: Endoscopy;  Laterality: N/A;  . PROSTATECTOMY    . SHOULDER ARTHROSCOPY    . WISDOM TOOTH EXTRACTION      Family History  Problem Relation Age of Onset  . Prostate cancer Neg Hx   . Bladder Cancer Neg Hx   . Kidney cancer Neg Hx     Social History   Socioeconomic History  . Marital status: Married    Spouse name: Not on file  . Number of children: Not on file  . Years of education: Not on file  . Highest education level: Not on file  Occupational History  . Not on file  Tobacco Use  . Smoking status: Never Smoker  . Smokeless tobacco: Never Used  Vaping Use  . Vaping Use: Never used   Substance and Sexual Activity  . Alcohol use: No    Comment: may have drink 5x/yr  . Drug use: No  . Sexual activity: Yes    Birth control/protection: None  Other Topics Concern  . Not on file  Social History Narrative  . Not on file   Social Determinants of Health   Financial Resource Strain:   . Difficulty of Paying Living Expenses: Not on file  Food Insecurity:   . Worried About Charity fundraiser in the Last Year: Not on file  . Ran Out of Food in the Last Year: Not on file  Transportation Needs:   . Lack of Transportation (Medical): Not on file  . Lack of Transportation (Non-Medical): Not on file  Physical Activity:   . Days of Exercise per Week: Not on file  . Minutes of Exercise per Session: Not on file  Stress:   . Feeling of Stress : Not on file  Social Connections:   . Frequency of Communication with Friends and Family: Not on file  . Frequency of Social Gatherings with Friends and Family: Not on file  . Attends Religious Services: Not on file  . Active Member of Clubs or Organizations: Not on file  . Attends Archivist Meetings: Not on file  .  Marital Status: Not on file  Intimate Partner Violence:   . Fear of Current or Ex-Partner: Not on file  . Emotionally Abused: Not on file  . Physically Abused: Not on file  . Sexually Abused: Not on file     Current Outpatient Medications:  .  Ascorbic Acid (VITAMIN C PO), Take by mouth daily., Disp: , Rfl:  .  ASPIRIN 81 PO, Take by mouth daily., Disp: , Rfl:  .  atenolol (TENORMIN) 50 MG tablet, TAKE 1 TABLET DAILY, Disp: 90 tablet, Rfl: 3 .  Cholecalciferol 25 MCG (1000 UT) tablet, Take by mouth., Disp: , Rfl:  .  citalopram (CELEXA) 20 MG tablet, Take by mouth., Disp: , Rfl:  .  fenofibrate 160 MG tablet, Take by mouth every other day. , Disp: , Rfl:  .  fluticasone (FLONASE) 50 MCG/ACT nasal spray, Place 2 sprays into both nostrils daily., Disp: 16 g, Rfl: 6 .  Glucosamine-Chondroitin (OSTEO BI-FLEX  REGULAR STRENGTH PO), Take by mouth 2 (two) times daily., Disp: , Rfl:  .  loratadine (CLARITIN) 10 MG tablet, Take 1 tablet (10 mg total) by mouth daily., Disp: 30 tablet, Rfl: 5 .  Melatonin 5 MG TABS, Take by mouth at bedtime as needed., Disp: , Rfl:  .  Multiple Vitamin (MULTIVITAMIN) capsule, Take by mouth., Disp: , Rfl:  .  nitroGLYCERIN (NITROSTAT) 0.3 MG SL tablet, PLACE 1 (ONE) TABLET SUBLINGUALLY (UNDER TONGUE) AS NEEDED, Disp: , Rfl:  .  omeprazole (PRILOSEC) 40 MG capsule, Take 40 mg by mouth daily as needed. , Disp: , Rfl:  .  prazosin (MINIPRESS) 1 MG capsule, Take 3 mg by mouth at bedtime. , Disp: , Rfl:  .  rosuvastatin (CRESTOR) 10 MG tablet, Take 1 tablet (10 mg total) by mouth daily., Disp: 90 tablet, Rfl: 3 .  TOVIAZ 4 MG TB24 tablet, TAKE 1 TABLET DAILY, Disp: 90 tablet, Rfl: 3 .  tretinoin (RETIN-A) 0.05 % cream, , Disp: , Rfl:    No Known Allergies  ROS Review of Systems  Constitutional: Negative.   HENT: Negative.   Eyes: Negative.   Respiratory: Negative.   Cardiovascular: Negative.   Gastrointestinal: Negative.   Endocrine: Negative.   Genitourinary: Negative.   Musculoskeletal: Negative.   Skin: Negative.   Allergic/Immunologic: Negative.   Neurological: Negative.   Hematological: Negative.   Psychiatric/Behavioral: Negative.   All other systems reviewed and are negative.     Objective:    Physical Exam Vitals reviewed.  Constitutional:      Appearance: Normal appearance.  HENT:     Mouth/Throat:     Mouth: Mucous membranes are moist.  Eyes:     Pupils: Pupils are equal, round, and reactive to light.  Neck:     Vascular: No carotid bruit.  Cardiovascular:     Rate and Rhythm: Normal rate and regular rhythm.     Pulses: Normal pulses.     Heart sounds: Normal heart sounds.  Pulmonary:     Effort: Pulmonary effort is normal.     Breath sounds: Normal breath sounds.  Abdominal:     General: Bowel sounds are normal.     Palpations: Abdomen  is soft. There is no hepatomegaly, splenomegaly or mass.     Tenderness: There is no abdominal tenderness.     Hernia: No hernia is present.  Musculoskeletal:     Cervical back: Neck supple.     Right lower leg: No edema.     Left lower leg: No edema.  Skin:    Findings: No rash.  Neurological:     Mental Status: He is alert and oriented to person, place, and time.     Motor: No weakness.  Psychiatric:        Mood and Affect: Mood normal.        Behavior: Behavior normal.     BP 123/74   Pulse 64   Ht 6' (1.829 m)   Wt 236 lb 8 oz (107.3 kg)   BMI 32.08 kg/m  Wt Readings from Last 3 Encounters:  06/18/20 236 lb 8 oz (107.3 kg)  12/30/19 236 lb 12.8 oz (107.4 kg)  09/09/19 242 lb (109.8 kg)     Health Maintenance Due  Topic Date Due  . Hepatitis C Screening  Never done  . TETANUS/TDAP  Never done  . PNA vac Low Risk Adult (1 of 2 - PCV13) Never done    There are no preventive care reminders to display for this patient.  Lab Results  Component Value Date   TSH 3.10 06/19/2020   Lab Results  Component Value Date   WBC 4.6 06/19/2020   HGB 13.9 06/19/2020   HCT 42.6 06/19/2020   MCV 95.5 06/19/2020   PLT 191 06/19/2020   Lab Results  Component Value Date   NA 140 06/19/2020   K 4.4 06/19/2020   CO2 26 06/19/2020   GLUCOSE 79 06/19/2020   BUN 23 06/19/2020   CREATININE 1.09 06/19/2020   BILITOT 0.7 06/19/2020   ALKPHOS 64 04/29/2018   AST 15 06/19/2020   ALT 17 06/19/2020   PROT 6.5 06/19/2020   ALBUMIN 4.7 04/29/2018   CALCIUM 9.7 06/19/2020   ANIONGAP 6 03/11/2018   Lab Results  Component Value Date   CHOL 159 06/19/2020   Lab Results  Component Value Date   HDL 54 06/19/2020   Lab Results  Component Value Date   LDLCALC 88 06/19/2020   Lab Results  Component Value Date   TRIG 78 06/19/2020   Lab Results  Component Value Date   CHOLHDL 2.9 06/19/2020   Lab Results  Component Value Date   HGBA1C 5.7 (H) 03/11/2018       Assessment & Plan:   Problem List Items Addressed This Visit      Cardiovascular and Mediastinum   Essential hypertension    Blood pressure stable on present drug regimen.  Patient was advised to follow DASH diet he was also advised to lose weight.      Relevant Medications   rosuvastatin (CRESTOR) 10 MG tablet   Coronary artery disease, non-occlusive   Relevant Medications   rosuvastatin (CRESTOR) 10 MG tablet     Respiratory   Seasonal allergic rhinitis due to pollen    Patient was advised to use Claritin as needed for allergic rhinitis.        Musculoskeletal and Integument   Dupuytren's contracture of hand    Patient was told that he should go and see a orthopedic surgeon with a hand specialization.  He will call me if he agrees to do that.        Genitourinary   Overactive bladder    Patient prostate is borderline enlarged we will check the PSA.  His thyroid is okay and hemoglobin is 13.6        Other   Annual physical exam - Primary    Patient physical examination is normal HEENT NAD neck is supple jugular venous pressure is not elevated there is no bruit  in the neck.  Heart is regular chest is clear abdominal soft nontender there is no pedal edema.  Patient has metabolic syndrome and he was advised to lose weight.  Refer to urologist for prostate problem.  Rectal exam is negative prostate is 2+.       Other Visit Diagnoses    Need for COVID-19 vaccine       Relevant Orders   Pfizer SARS-COV-2 Vaccine (Completed)      Meds ordered this encounter  Medications  . rosuvastatin (CRESTOR) 10 MG tablet    Sig: Take 1 tablet (10 mg total) by mouth daily.    Dispense:  90 tablet    Refill:  3    Follow-up: No follow-ups on file.    Cletis Athens, MD

## 2020-06-19 ENCOUNTER — Ambulatory Visit (INDEPENDENT_AMBULATORY_CARE_PROVIDER_SITE_OTHER): Payer: Medicare Other | Admitting: Internal Medicine

## 2020-06-19 DIAGNOSIS — E785 Hyperlipidemia, unspecified: Secondary | ICD-10-CM

## 2020-06-19 DIAGNOSIS — N3281 Overactive bladder: Secondary | ICD-10-CM

## 2020-06-19 DIAGNOSIS — N529 Male erectile dysfunction, unspecified: Secondary | ICD-10-CM

## 2020-06-19 DIAGNOSIS — Z8546 Personal history of malignant neoplasm of prostate: Secondary | ICD-10-CM

## 2020-06-19 DIAGNOSIS — I10 Essential (primary) hypertension: Secondary | ICD-10-CM

## 2020-06-20 LAB — CBC WITH DIFFERENTIAL/PLATELET
Absolute Monocytes: 409 cells/uL (ref 200–950)
Basophils Absolute: 18 cells/uL (ref 0–200)
Basophils Relative: 0.4 %
Eosinophils Absolute: 221 cells/uL (ref 15–500)
Eosinophils Relative: 4.8 %
HCT: 42.6 % (ref 38.5–50.0)
Hemoglobin: 13.9 g/dL (ref 13.2–17.1)
Lymphs Abs: 1081 cells/uL (ref 850–3900)
MCH: 31.2 pg (ref 27.0–33.0)
MCHC: 32.6 g/dL (ref 32.0–36.0)
MCV: 95.5 fL (ref 80.0–100.0)
MPV: 11.2 fL (ref 7.5–12.5)
Monocytes Relative: 8.9 %
Neutro Abs: 2870 cells/uL (ref 1500–7800)
Neutrophils Relative %: 62.4 %
Platelets: 191 10*3/uL (ref 140–400)
RBC: 4.46 10*6/uL (ref 4.20–5.80)
RDW: 11.8 % (ref 11.0–15.0)
Total Lymphocyte: 23.5 %
WBC: 4.6 10*3/uL (ref 3.8–10.8)

## 2020-06-20 LAB — COMPLETE METABOLIC PANEL WITH GFR
AG Ratio: 2.4 (calc) (ref 1.0–2.5)
ALT: 17 U/L (ref 9–46)
AST: 15 U/L (ref 10–35)
Albumin: 4.6 g/dL (ref 3.6–5.1)
Alkaline phosphatase (APISO): 55 U/L (ref 35–144)
BUN: 23 mg/dL (ref 7–25)
CO2: 26 mmol/L (ref 20–32)
Calcium: 9.7 mg/dL (ref 8.6–10.3)
Chloride: 103 mmol/L (ref 98–110)
Creat: 1.09 mg/dL (ref 0.70–1.18)
GFR, Est African American: 78 mL/min/{1.73_m2} (ref >=60)
GFR, Est Non African American: 67 mL/min/{1.73_m2} (ref >=60)
Globulin: 1.9 g/dL (calc) (ref 1.9–3.7)
Glucose, Bld: 79 mg/dL (ref 65–99)
Potassium: 4.4 mmol/L (ref 3.5–5.3)
Sodium: 140 mmol/L (ref 135–146)
Total Bilirubin: 0.7 mg/dL (ref 0.2–1.2)
Total Protein: 6.5 g/dL (ref 6.1–8.1)

## 2020-06-20 LAB — LIPID PANEL
Cholesterol: 159 mg/dL (ref <200)
HDL: 54 mg/dL (ref >=40)
LDL Cholesterol (Calc): 88 mg/dL (calc)
Non-HDL Cholesterol (Calc): 105 mg/dL (calc) (ref <130)
Total CHOL/HDL Ratio: 2.9 (calc) (ref <5.0)
Triglycerides: 78 mg/dL (ref <150)

## 2020-06-20 LAB — TSH: TSH: 3.1 mIU/L (ref 0.40–4.50)

## 2020-06-20 LAB — PSA: PSA: <0.04 ng/mL (ref <=4.0)

## 2020-06-22 NOTE — Assessment & Plan Note (Signed)
Blood pressure stable on present drug regimen.  Patient was advised to follow DASH diet he was also advised to lose weight.

## 2020-06-22 NOTE — Assessment & Plan Note (Signed)
Patient prostate is borderline enlarged we will check the PSA.  His thyroid is okay and hemoglobin is 13.6

## 2020-06-22 NOTE — Assessment & Plan Note (Signed)
Patient physical examination is normal HEENT NAD neck is supple jugular venous pressure is not elevated there is no bruit in the neck.  Heart is regular chest is clear abdominal soft nontender there is no pedal edema.  Patient has metabolic syndrome and he was advised to lose weight.  Refer to urologist for prostate problem.  Rectal exam is negative prostate is 2+.

## 2020-06-22 NOTE — Assessment & Plan Note (Signed)
Patient was told that he should go and see a orthopedic surgeon with a hand specialization.  He will call me if he agrees to do that.

## 2020-06-22 NOTE — Assessment & Plan Note (Signed)
Patient was advised to use Claritin as needed for allergic rhinitis.

## 2020-06-25 ENCOUNTER — Other Ambulatory Visit: Payer: Self-pay

## 2020-06-25 ENCOUNTER — Ambulatory Visit (INDEPENDENT_AMBULATORY_CARE_PROVIDER_SITE_OTHER): Payer: Medicare Other | Admitting: Internal Medicine

## 2020-06-25 ENCOUNTER — Encounter: Payer: Self-pay | Admitting: Internal Medicine

## 2020-06-25 VITALS — BP 118/72 | HR 68 | Ht 74.0 in | Wt 237.5 lb

## 2020-06-25 DIAGNOSIS — N3281 Overactive bladder: Secondary | ICD-10-CM | POA: Diagnosis not present

## 2020-06-25 DIAGNOSIS — E7849 Other hyperlipidemia: Secondary | ICD-10-CM | POA: Diagnosis not present

## 2020-06-25 DIAGNOSIS — I10 Essential (primary) hypertension: Secondary | ICD-10-CM | POA: Diagnosis not present

## 2020-06-25 DIAGNOSIS — N529 Male erectile dysfunction, unspecified: Secondary | ICD-10-CM

## 2020-06-25 NOTE — Assessment & Plan Note (Signed)
Lipids are stable on statin and fenofibrate.

## 2020-06-25 NOTE — Assessment & Plan Note (Signed)
Blood pressure stable on the present medication.

## 2020-06-25 NOTE — Assessment & Plan Note (Signed)
Patient has a history of prostatectomy.  He is on Toviaz and PSA level is okay.

## 2020-06-25 NOTE — Progress Notes (Signed)
Established Patient Office Visit  Subjective:  Patient ID: Darren ERVEN Sr., male    DOB: [birth year]  Age: 74 y.o. MRN: [id number]  CC:  Chief Complaint  Patient presents with  . lab results     HPI  Darren Alu Sr. presents for lab results  Past Medical History:  Diagnosis Date  . Arthritis   . Atrial fibrillation ([place])   . GERD (gastroesophageal reflux disease)   . History of prostate cancer   . Hyperlipidemia 09/09/2018  . Personal history of prostate cancer 08/26/2017  . Sciatica    bilateral, intermittent  . Sleep apnea    CPAP    Past Surgical History:  Procedure Laterality Date  . COLONOSCOPY WITH PROPOFOL N/A 01/24/2019   Procedure: COLONOSCOPY WITH BIOPSY;  Surgeon: Lucilla Lame, MD;  Location: [place];  Service: Endoscopy;  Laterality: N/A;  sleep apnea  . KNEE ARTHROSCOPY    . NOSE SURGERY    . POLYPECTOMY N/A 01/24/2019   Procedure: POLYPECTOMY;  Surgeon: Lucilla Lame, MD;  Location: [place];  Service: Endoscopy;  Laterality: N/A;  . PROSTATECTOMY    . SHOULDER ARTHROSCOPY    . WISDOM TOOTH EXTRACTION      Family History  Problem Relation Age of Onset  . Prostate cancer Neg Hx   . Bladder Cancer Neg Hx   . Kidney cancer Neg Hx     Social History   Socioeconomic History  . Marital status: Married    Spouse name: Not on file  . Number of children: Not on file  . Years of education: Not on file  . Highest education level: Not on file  Occupational History  . Not on file  Tobacco Use  . Smoking status: Never Smoker  . Smokeless tobacco: Never Used  Vaping Use  . Vaping Use: Never used  Substance and Sexual Activity  . Alcohol use: No    Comment: may have drink 5x/yr  . Drug use: No  . Sexual activity: Yes    Birth control/protection: None  Other Topics Concern  . Not on file  Social History Narrative  . Not on file   Social Determinants of Health   Financial Resource Strain:   . Difficulty of Paying  Living Expenses: Not on file  Food Insecurity:   . Worried About Charity fundraiser in the Last Year: Not on file  . Ran Out of Food in the Last Year: Not on file  Transportation Needs:   . Lack of Transportation (Medical): Not on file  . Lack of Transportation (Non-Medical): Not on file  Physical Activity:   . Days of Exercise per Week: Not on file  . Minutes of Exercise per Session: Not on file  Stress:   . Feeling of Stress : Not on file  Social Connections:   . Frequency of Communication with Friends and Family: Not on file  . Frequency of Social Gatherings with Friends and Family: Not on file  . Attends Religious Services: Not on file  . Active Member of Clubs or Organizations: Not on file  . Attends Archivist Meetings: Not on file  . Marital Status: Not on file  Intimate Partner Violence:   . Fear of Current or Ex-Partner: Not on file  . Emotionally Abused: Not on file  . Physically Abused: Not on file  . Sexually Abused: Not on file     Current Outpatient Medications:  .  Ascorbic Acid (VITAMIN C PO),  Take by mouth daily., Disp: , Rfl:  .  ASPIRIN 81 PO, Take by mouth daily., Disp: , Rfl:  .  atenolol (TENORMIN) 50 MG tablet, TAKE 1 TABLET DAILY, Disp: 90 tablet, Rfl: 3 .  Cholecalciferol 25 MCG (1000 UT) tablet, Take by mouth., Disp: , Rfl:  .  citalopram (CELEXA) 20 MG tablet, Take by mouth., Disp: , Rfl:  .  fenofibrate 160 MG tablet, Take by mouth every other day. , Disp: , Rfl:  .  Glucosamine-Chondroitin (OSTEO BI-FLEX REGULAR STRENGTH PO), Take by mouth 2 (two) times daily., Disp: , Rfl:  .  loratadine (CLARITIN) 10 MG tablet, Take 1 tablet (10 mg total) by mouth daily., Disp: 30 tablet, Rfl: 5 .  Melatonin 5 MG TABS, Take by mouth at bedtime as needed., Disp: , Rfl:  .  Multiple Vitamin (MULTIVITAMIN) capsule, Take by mouth., Disp: , Rfl:  .  nitroGLYCERIN (NITROSTAT) 0.3 MG SL tablet, PLACE 1 (ONE) TABLET SUBLINGUALLY (UNDER TONGUE) AS NEEDED, Disp: ,  Rfl:  .  omeprazole (PRILOSEC) 40 MG capsule, Take 40 mg by mouth daily as needed. , Disp: , Rfl:  .  prazosin (MINIPRESS) 1 MG capsule, Take 3 mg by mouth at bedtime. , Disp: , Rfl:  .  rosuvastatin (CRESTOR) 10 MG tablet, Take 1 tablet (10 mg total) by mouth daily., Disp: 90 tablet, Rfl: 3 .  TOVIAZ 4 MG TB24 tablet, TAKE 1 TABLET DAILY, Disp: 90 tablet, Rfl: 3 .  tretinoin (RETIN-A) 0.05 % cream, , Disp: , Rfl:  .  fluticasone (FLONASE) 50 MCG/ACT nasal spray, Place 2 sprays into both nostrils daily., Disp: 16 g, Rfl: 6   No Known Allergies  ROS Review of Systems  Constitutional: Negative.   HENT: Negative.   Eyes: Negative.   Respiratory: Negative.   Cardiovascular: Negative.   Gastrointestinal: Negative.   Endocrine: Negative.   Genitourinary: Negative.   Musculoskeletal: Negative.   Skin: Negative.   Allergic/Immunologic: Negative.   Neurological: Negative.   Hematological: Negative.   Psychiatric/Behavioral: Negative.   All other systems reviewed and are negative.     Objective:    Physical Exam Vitals reviewed.  Constitutional:      Appearance: Normal appearance.  HENT:     Mouth/Throat:     Mouth: Mucous membranes are moist.  Eyes:     Pupils: Pupils are equal, round, and reactive to light.  Neck:     Vascular: No carotid bruit.  Cardiovascular:     Rate and Rhythm: Normal rate and regular rhythm.     Pulses: Normal pulses.     Heart sounds: Normal heart sounds.  Pulmonary:     Effort: Pulmonary effort is normal.     Breath sounds: Normal breath sounds.  Abdominal:     General: Bowel sounds are normal.     Palpations: Abdomen is soft. There is no hepatomegaly, splenomegaly or mass.     Tenderness: There is no abdominal tenderness.     Hernia: No hernia is present.  Musculoskeletal:     Cervical back: Neck supple.     Right lower leg: No edema.     Left lower leg: No edema.  Skin:    Findings: No rash.  Neurological:     Mental Status: He is alert  and oriented to person, place, and time.     Motor: No weakness.  Psychiatric:        Mood and Affect: Mood normal.        Behavior: Behavior  normal.     BP 118/72   Pulse 68   Ht 6\' 2"  (1.88 m)   Wt 237 lb 8 oz (107.7 kg)   BMI 30.49 kg/m  Wt Readings from Last 3 Encounters:  06/25/20 237 lb 8 oz (107.7 kg)  06/18/20 236 lb 8 oz (107.3 kg)  12/30/19 236 lb 12.8 oz (107.4 kg)     Health Maintenance Due  Topic Date Due  . Hepatitis C Screening  Never done  . TETANUS/TDAP  Never done  . PNA vac Low Risk Adult (1 of 2 - PCV13) Never done    There are no preventive care reminders to display for this patient.  Lab Results  Component Value Date   TSH 3.10 06/19/2020   Lab Results  Component Value Date   WBC 4.6 06/19/2020   HGB 13.9 06/19/2020   HCT 42.6 06/19/2020   MCV 95.5 06/19/2020   PLT 191 06/19/2020   Lab Results  Component Value Date   NA 140 06/19/2020   K 4.4 06/19/2020   CO2 26 06/19/2020   GLUCOSE 79 06/19/2020   BUN 23 06/19/2020   CREATININE 1.09 06/19/2020   BILITOT 0.7 06/19/2020   ALKPHOS 64 04/29/2018   AST 15 06/19/2020   ALT 17 06/19/2020   PROT 6.5 06/19/2020   ALBUMIN 4.7 04/29/2018   CALCIUM 9.7 06/19/2020   ANIONGAP 6 03/11/2018   Lab Results  Component Value Date   CHOL 159 06/19/2020   Lab Results  Component Value Date   HDL 54 06/19/2020   Lab Results  Component Value Date   LDLCALC 88 06/19/2020   Lab Results  Component Value Date   TRIG 78 06/19/2020   Lab Results  Component Value Date   CHOLHDL 2.9 06/19/2020   Lab Results  Component Value Date   HGBA1C 5.7 (H) 03/11/2018      Assessment & Plan:   Problem List Items Addressed This Visit      Cardiovascular and Mediastinum   Essential hypertension - Primary    Blood pressure stable on the present medication.        Genitourinary   Overactive bladder    Patient has a history of prostatectomy.  He is on Toviaz and PSA level is okay.        Other    ED (erectile dysfunction) of organic origin    He does not complain of any problem of ED today      Hyperlipidemia    Lipids are stable on statin and fenofibrate.         No orders of the defined types were placed in this encounter.   Follow-up: No follow-ups on file.    Cletis Athens, MD

## 2020-06-25 NOTE — Assessment & Plan Note (Addendum)
He does not complain of any problem of ED today

## 2020-06-26 ENCOUNTER — Other Ambulatory Visit: Payer: Self-pay

## 2020-06-26 DIAGNOSIS — I2510 Atherosclerotic heart disease of native coronary artery without angina pectoris: Secondary | ICD-10-CM

## 2020-06-26 MED ORDER — ROSUVASTATIN CALCIUM 10 MG PO TABS: 10.0000 mg | ORAL_TABLET | Freq: Every day | ORAL | 3 refills | Status: DC

## 2020-08-17 ENCOUNTER — Ambulatory Visit: Payer: Medicare Other | Admitting: Family Medicine

## 2020-08-17 ENCOUNTER — Other Ambulatory Visit: Payer: Self-pay

## 2020-08-21 DIAGNOSIS — Z9079 Acquired absence of other genital organ(s): Secondary | ICD-10-CM | POA: Insufficient documentation

## 2020-08-21 DIAGNOSIS — K317 Polyp of stomach and duodenum: Secondary | ICD-10-CM | POA: Insufficient documentation

## 2020-08-21 DIAGNOSIS — G4733 Obstructive sleep apnea (adult) (pediatric): Secondary | ICD-10-CM | POA: Insufficient documentation

## 2020-08-29 ENCOUNTER — Other Ambulatory Visit: Payer: Self-pay | Admitting: Internal Medicine

## 2020-08-29 ENCOUNTER — Other Ambulatory Visit: Payer: Self-pay

## 2020-08-29 ENCOUNTER — Ambulatory Visit (INDEPENDENT_AMBULATORY_CARE_PROVIDER_SITE_OTHER): Payer: Medicare Other | Admitting: Internal Medicine

## 2020-08-29 ENCOUNTER — Encounter: Payer: Self-pay | Admitting: Internal Medicine

## 2020-08-29 VITALS — BP 111/61 | HR 72 | Ht 72.0 in | Wt 231.1 lb

## 2020-08-29 DIAGNOSIS — K922 Gastrointestinal hemorrhage, unspecified: Secondary | ICD-10-CM

## 2020-08-29 DIAGNOSIS — J301 Allergic rhinitis due to pollen: Secondary | ICD-10-CM

## 2020-08-29 DIAGNOSIS — K920 Hematemesis: Secondary | ICD-10-CM

## 2020-08-29 DIAGNOSIS — I10 Essential (primary) hypertension: Secondary | ICD-10-CM | POA: Diagnosis not present

## 2020-08-29 DIAGNOSIS — M461 Sacroiliitis, not elsewhere classified: Secondary | ICD-10-CM | POA: Diagnosis not present

## 2020-08-29 HISTORY — DX: Gastrointestinal hemorrhage, unspecified: K92.2

## 2020-08-29 NOTE — Assessment & Plan Note (Signed)
Patient has a polyp removed in [facility] which was causing the GI bleeding.  He is stable right now we will check CBC.

## 2020-08-29 NOTE — Assessment & Plan Note (Signed)
stable °

## 2020-08-29 NOTE — Assessment & Plan Note (Signed)
Patient was advised to take Claritin 5 mg p.o. daily as needed for allergies

## 2020-08-29 NOTE — Progress Notes (Signed)
Established Patient Office Visit  Subjective:  Patient ID: Darren JOLLIFF Sr., male    DOB: 04-Nov-1945  Age: 75 y.o. MRN: [id number]  CC:  Chief Complaint  Patient presents with  . Hospitalization Follow-up    Admitted on 08/17/20 discharged 08/22/20. Patient was treated for GI bleed.     HPI  Darren BOROWSKI Sr. presents for checkup..  Patient was treated in [facility] for GI bleeding pedunculated polyp was clipped.  He is doing well now denies any history of nausea vomiting diarrhea black stool.  He denies any chest pain or shortness of breath.  CBC and CMP will be drawn to evaluate, anemia  Past Medical History:  Diagnosis Date  . Arthritis   . Atrial fibrillation ([place])   . GERD (gastroesophageal reflux disease)   . History of prostate cancer   . Hyperlipidemia 09/09/2018  . Personal history of prostate cancer 08/26/2017  . Sciatica    bilateral, intermittent  . Sleep apnea    CPAP    Past Surgical History:  Procedure Laterality Date  . COLONOSCOPY WITH PROPOFOL N/A 01/24/2019   Procedure: COLONOSCOPY WITH BIOPSY;  Surgeon: Lucilla Lame, MD;  Location: [place];  Service: Endoscopy;  Laterality: N/A;  sleep apnea  . KNEE ARTHROSCOPY    . NOSE SURGERY    . POLYPECTOMY N/A 01/24/2019   Procedure: POLYPECTOMY;  Surgeon: Lucilla Lame, MD;  Location: [place];  Service: Endoscopy;  Laterality: N/A;  . PROSTATECTOMY    . SHOULDER ARTHROSCOPY    . WISDOM TOOTH EXTRACTION      Family History  Problem Relation Age of Onset  . Prostate cancer Neg Hx   . Bladder Cancer Neg Hx   . Kidney cancer Neg Hx     Social History   Socioeconomic History  . Marital status: Married    Spouse name: Not on file  . Number of children: Not on file  . Years of education: Not on file  . Highest education level: Not on file  Occupational History  . Not on file  Tobacco Use  . Smoking status: Never Smoker  . Smokeless tobacco: Never Used  Vaping Use  . Vaping Use:  Never used  Substance and Sexual Activity  . Alcohol use: No    Comment: may have drink 5x/yr  . Drug use: No  . Sexual activity: Yes    Birth control/protection: None  Other Topics Concern  . Not on file  Social History Narrative  . Not on file   Social Determinants of Health   Financial Resource Strain: Not on file  Food Insecurity: Not on file  Transportation Needs: Not on file  Physical Activity: Not on file  Stress: Not on file  Social Connections: Not on file  Intimate Partner Violence: Not on file     Current Outpatient Medications:  .  Ascorbic Acid (VITAMIN C PO), Take by mouth daily., Disp: , Rfl:  .  ASPIRIN 81 PO, Take by mouth daily., Disp: , Rfl:  .  atenolol (TENORMIN) 50 MG tablet, TAKE 1 TABLET DAILY, Disp: 90 tablet, Rfl: 3 .  Cholecalciferol 25 MCG (1000 UT) tablet, Take by mouth., Disp: , Rfl:  .  citalopram (CELEXA) 20 MG tablet, Take by mouth., Disp: , Rfl:  .  fenofibrate 160 MG tablet, Take by mouth every other day. , Disp: , Rfl:  .  Glucosamine-Chondroitin (OSTEO BI-FLEX REGULAR STRENGTH PO), Take by mouth 2 (two) times daily., Disp: , Rfl:  .  loratadine (CLARITIN) 10 MG tablet, Take 1 tablet (10 mg total) by mouth daily., Disp: 30 tablet, Rfl: 5 .  Melatonin 5 MG TABS, Take by mouth at bedtime as needed., Disp: , Rfl:  .  Multiple Vitamin (MULTIVITAMIN) capsule, Take by mouth., Disp: , Rfl:  .  nitroGLYCERIN (NITROSTAT) 0.3 MG SL tablet, PLACE 1 (ONE) TABLET SUBLINGUALLY (UNDER TONGUE) AS NEEDED, Disp: , Rfl:  .  omeprazole (PRILOSEC) 40 MG capsule, Take 40 mg by mouth daily as needed. , Disp: , Rfl:  .  prazosin (MINIPRESS) 1 MG capsule, Take 3 mg by mouth at bedtime. , Disp: , Rfl:  .  rosuvastatin (CRESTOR) 10 MG tablet, Take 1 tablet (10 mg total) by mouth daily., Disp: 90 tablet, Rfl: 3 .  TOVIAZ 4 MG TB24 tablet, TAKE 1 TABLET DAILY, Disp: 90 tablet, Rfl: 3 .  tretinoin (RETIN-A) 0.05 % cream, , Disp: , Rfl:  .  fluticasone (FLONASE) 50  MCG/ACT nasal spray, Place 2 sprays into both nostrils daily., Disp: 16 g, Rfl: 6   No Known Allergies  ROS Review of Systems  Constitutional: Negative.   HENT: Negative.   Eyes: Negative.   Respiratory: Negative.   Cardiovascular: Negative.   Gastrointestinal: Negative.   Endocrine: Negative.   Genitourinary: Negative.   Musculoskeletal: Negative.   Skin: Negative.   Allergic/Immunologic: Negative.   Neurological: Negative.   Hematological: Negative.   Psychiatric/Behavioral: Negative.   All other systems reviewed and are negative.     Objective:    Physical Exam Vitals reviewed.  Constitutional:      Appearance: Normal appearance.  HENT:     Mouth/Throat:     Mouth: Mucous membranes are moist.  Eyes:     Pupils: Pupils are equal, round, and reactive to light.  Neck:     Vascular: No carotid bruit.  Cardiovascular:     Rate and Rhythm: Normal rate and regular rhythm.     Pulses: Normal pulses.     Heart sounds: Normal heart sounds.  Pulmonary:     Effort: Pulmonary effort is normal.     Breath sounds: Normal breath sounds.  Abdominal:     General: Bowel sounds are normal.     Palpations: Abdomen is soft. There is no hepatomegaly, splenomegaly or mass.     Tenderness: There is no abdominal tenderness.     Hernia: No hernia is present.  Musculoskeletal:     Cervical back: Neck supple.     Right lower leg: No edema.     Left lower leg: No edema.  Skin:    Findings: No rash.  Neurological:     Mental Status: He is alert and oriented to person, place, and time.     Motor: No weakness.  Psychiatric:        Mood and Affect: Mood normal.        Behavior: Behavior normal.     BP 111/61   Pulse 72   Ht 6' (1.829 m)   Wt 231 lb 1.6 oz (104.8 kg)   BMI 31.34 kg/m  Wt Readings from Last 3 Encounters:  08/29/20 231 lb 1.6 oz (104.8 kg)  06/25/20 237 lb 8 oz (107.7 kg)  06/18/20 236 lb 8 oz (107.3 kg)     Health Maintenance Due  Topic Date Due  .  Hepatitis C Screening  Never done  . TETANUS/TDAP  Never done  . PNA vac Low Risk Adult (1 of 2 - PCV13) Never done  There are no preventive care reminders to display for this patient.  Lab Results  Component Value Date   TSH 3.10 06/19/2020   Lab Results  Component Value Date   WBC 4.6 06/19/2020   HGB 13.9 06/19/2020   HCT 42.6 06/19/2020   MCV 95.5 06/19/2020   PLT 191 06/19/2020   Lab Results  Component Value Date   NA 140 06/19/2020   K 4.4 06/19/2020   CO2 26 06/19/2020   GLUCOSE 79 06/19/2020   BUN 23 06/19/2020   CREATININE 1.09 06/19/2020   BILITOT 0.7 06/19/2020   ALKPHOS 64 04/29/2018   AST 15 06/19/2020   ALT 17 06/19/2020   PROT 6.5 06/19/2020   ALBUMIN 4.7 04/29/2018   CALCIUM 9.7 06/19/2020   ANIONGAP 6 03/11/2018   Lab Results  Component Value Date   CHOL 159 06/19/2020   Lab Results  Component Value Date   HDL 54 06/19/2020   Lab Results  Component Value Date   LDLCALC 88 06/19/2020   Lab Results  Component Value Date   TRIG 78 06/19/2020   Lab Results  Component Value Date   CHOLHDL 2.9 06/19/2020   Lab Results  Component Value Date   HGBA1C 5.7 (H) 03/11/2018      Assessment & Plan:   Problem List Items Addressed This Visit      Cardiovascular and Mediastinum   Essential hypertension    - Today, the patient's blood pressure is well managed on betablockers. - The patient will continue the current treatment regimen.  - I encouraged the patient to eat a low-sodium diet to help control blood pressure. - I encouraged the patient to live an active lifestyle and complete activities that increases heart rate to 85% target heart rate at least 5 times per week for one hour.            Respiratory   Seasonal allergic rhinitis due to pollen    Patient was advised to take Claritin 5 mg p.o. daily as needed for allergies        Digestive   Gastrointestinal hemorrhage - Primary    Patient has a polyp removed in [facility]  which was causing the GI bleeding.  He is stable right now we will check CBC.      Relevant Orders   CBC with Differential/Platelet   COMPLETE METABOLIC PANEL WITH GFR     Musculoskeletal and Integument   Sacroiliitis ([facility])    stable         No orders of the defined types were placed in this encounter.   Follow-up: No follow-ups on file.  Patient with multiple pneumonia.  He is stable we will check a CBC CMP abdominal soft nontender chest is clear   Cletis Athens, MD

## 2020-08-29 NOTE — Assessment & Plan Note (Signed)
-   Today, the patient's blood pressure is well managed on betablockers. - The patient will continue the current treatment regimen.  - I encouraged the patient to eat a low-sodium diet to help control blood pressure. - I encouraged the patient to live an active lifestyle and complete activities that increases heart rate to 85% target heart rate at least 5 times per week for one hour.     

## 2020-09-03 LAB — CBC WITH DIFFERENTIAL/PLATELET
Absolute Monocytes: 392 cells/uL (ref 200–950)
Basophils Absolute: 31 cells/uL (ref 0–200)
Basophils Relative: 0.7 %
Eosinophils Absolute: 220 cells/uL (ref 15–500)
Eosinophils Relative: 5 %
HCT: 24.6 % — ABNORMAL LOW (ref 38.5–50.0)
Hemoglobin: 7.9 g/dL — ABNORMAL LOW (ref 13.2–17.1)
Lymphs Abs: 986 cells/uL (ref 850–3900)
MCH: 31.2 pg (ref 27.0–33.0)
MCHC: 32.1 g/dL (ref 32.0–36.0)
MCV: 97.2 fL (ref 80.0–100.0)
MPV: 10.4 fL (ref 7.5–12.5)
Monocytes Relative: 8.9 %
Neutro Abs: 2772 cells/uL (ref 1500–7800)
Neutrophils Relative %: 63 %
Platelets: 301 10*3/uL (ref 140–400)
RBC: 2.53 10*6/uL — ABNORMAL LOW (ref 4.20–5.80)
RDW: 12.9 % (ref 11.0–15.0)
Total Lymphocyte: 22.4 %
WBC: 4.4 10*3/uL (ref 3.8–10.8)

## 2020-09-03 LAB — HOUSE ACCOUNT TRACKING

## 2020-09-03 LAB — EXTRA SPECIMEN

## 2020-09-05 ENCOUNTER — Encounter: Payer: Self-pay | Admitting: Internal Medicine

## 2020-09-05 ENCOUNTER — Ambulatory Visit (INDEPENDENT_AMBULATORY_CARE_PROVIDER_SITE_OTHER): Payer: Medicare Other | Admitting: Internal Medicine

## 2020-09-05 VITALS — BP 115/66 | HR 72 | Ht 72.0 in | Wt 231.1 lb

## 2020-09-05 DIAGNOSIS — J301 Allergic rhinitis due to pollen: Secondary | ICD-10-CM | POA: Diagnosis not present

## 2020-09-05 DIAGNOSIS — I2510 Atherosclerotic heart disease of native coronary artery without angina pectoris: Secondary | ICD-10-CM | POA: Diagnosis not present

## 2020-09-05 DIAGNOSIS — K920 Hematemesis: Secondary | ICD-10-CM | POA: Diagnosis not present

## 2020-09-05 DIAGNOSIS — I10 Essential (primary) hypertension: Secondary | ICD-10-CM

## 2020-09-05 NOTE — Assessment & Plan Note (Signed)
Take claritin  As needed

## 2020-09-05 NOTE — Assessment & Plan Note (Signed)
Patient condition is stable.  GI bleed.  Is stable.

## 2020-09-05 NOTE — Progress Notes (Signed)
Established Patient Office Visit  Subjective:  Patient ID: Darren VALERA Sr., male    DOB: September 08, 1945  Age: 75 y.o. MRN: [id number]  CC:  Chief Complaint  Patient presents with  . PHQ-9 12 Week Follow-up    HPI  Darren MATHENIA Sr. presents for check up  Past Medical History:  Diagnosis Date  . Arthritis   . Atrial fibrillation ([place])   . GERD (gastroesophageal reflux disease)   . History of prostate cancer   . Hyperlipidemia 09/09/2018  . Personal history of prostate cancer 08/26/2017  . Sciatica    bilateral, intermittent  . Sleep apnea    CPAP    Past Surgical History:  Procedure Laterality Date  . COLONOSCOPY WITH PROPOFOL N/A 01/24/2019   Procedure: COLONOSCOPY WITH BIOPSY;  Surgeon: Lucilla Lame, MD;  Location: [facility];  Service: Endoscopy;  Laterality: N/A;  sleep apnea  . KNEE ARTHROSCOPY    . NOSE SURGERY    . POLYPECTOMY N/A 01/24/2019   Procedure: POLYPECTOMY;  Surgeon: Lucilla Lame, MD;  Location: [facility];  Service: Endoscopy;  Laterality: N/A;  . PROSTATECTOMY    . SHOULDER ARTHROSCOPY    . WISDOM TOOTH EXTRACTION      Family History  Problem Relation Age of Onset  . Prostate cancer Neg Hx   . Bladder Cancer Neg Hx   . Kidney cancer Neg Hx     Social History   Socioeconomic History  . Marital status: Married    Spouse name: Not on file  . Number of children: Not on file  . Years of education: Not on file  . Highest education level: Not on file  Occupational History  . Not on file  Tobacco Use  . Smoking status: Never Smoker  . Smokeless tobacco: Never Used  Vaping Use  . Vaping Use: Never used  Substance and Sexual Activity  . Alcohol use: No    Comment: may have drink 5x/yr  . Drug use: No  . Sexual activity: Yes    Birth control/protection: None  Other Topics Concern  . Not on file  Social History Narrative  . Not on file   Social Determinants of Health   Financial Resource Strain: Not on file  Food  Insecurity: Not on file  Transportation Needs: Not on file  Physical Activity: Not on file  Stress: Not on file  Social Connections: Not on file  Intimate Partner Violence: Not on file     Current Outpatient Medications:  .  Ascorbic Acid (VITAMIN C PO), Take by mouth daily., Disp: , Rfl:  .  ASPIRIN 81 PO, Take by mouth daily., Disp: , Rfl:  .  atenolol (TENORMIN) 50 MG tablet, TAKE 1 TABLET DAILY, Disp: 90 tablet, Rfl: 3 .  Cholecalciferol 25 MCG (1000 UT) tablet, Take by mouth., Disp: , Rfl:  .  citalopram (CELEXA) 20 MG tablet, Take by mouth., Disp: , Rfl:  .  fenofibrate 160 MG tablet, Take by mouth every other day. , Disp: , Rfl:  .  Glucosamine-Chondroitin (OSTEO BI-FLEX REGULAR STRENGTH PO), Take by mouth 2 (two) times daily., Disp: , Rfl:  .  loratadine (CLARITIN) 10 MG tablet, Take 1 tablet (10 mg total) by mouth daily., Disp: 30 tablet, Rfl: 5 .  Melatonin 5 MG TABS, Take by mouth at bedtime as needed., Disp: , Rfl:  .  Multiple Vitamin (MULTIVITAMIN) capsule, Take by mouth., Disp: , Rfl:  .  nitroGLYCERIN (NITROSTAT) 0.3 MG SL tablet, PLACE  1 (ONE) TABLET SUBLINGUALLY (UNDER TONGUE) AS NEEDED, Disp: , Rfl:  .  omeprazole (PRILOSEC) 40 MG capsule, Take 40 mg by mouth daily as needed. , Disp: , Rfl:  .  prazosin (MINIPRESS) 1 MG capsule, Take 3 mg by mouth at bedtime. , Disp: , Rfl:  .  rosuvastatin (CRESTOR) 10 MG tablet, Take 1 tablet (10 mg total) by mouth daily., Disp: 90 tablet, Rfl: 3 .  TOVIAZ 4 MG TB24 tablet, TAKE 1 TABLET DAILY, Disp: 90 tablet, Rfl: 3 .  tretinoin (RETIN-A) 0.05 % cream, , Disp: , Rfl:  .  fluticasone (FLONASE) 50 MCG/ACT nasal spray, Place 2 sprays into both nostrils daily., Disp: 16 g, Rfl: 6   No Known Allergies  ROS Review of Systems  Constitutional: Negative.   HENT: Negative.   Eyes: Negative.   Respiratory: Negative.   Cardiovascular: Negative.   Gastrointestinal: Negative.   Endocrine: Negative.   Genitourinary: Negative.    Musculoskeletal: Negative.   Skin: Negative.   Allergic/Immunologic: Negative.   Neurological: Negative.   Hematological: Negative.   Psychiatric/Behavioral: Negative.   All other systems reviewed and are negative.     Objective:    Physical Exam Vitals reviewed.  Constitutional:      Appearance: Normal appearance.  HENT:     Mouth/Throat:     Mouth: Mucous membranes are moist.  Eyes:     Pupils: Pupils are equal, round, and reactive to light.  Neck:     Vascular: No carotid bruit.  Cardiovascular:     Rate and Rhythm: Normal rate and regular rhythm.     Pulses: Normal pulses.     Heart sounds: Normal heart sounds.  Pulmonary:     Effort: Pulmonary effort is normal.     Breath sounds: Normal breath sounds.  Abdominal:     General: Bowel sounds are normal.     Palpations: Abdomen is soft. There is no hepatomegaly, splenomegaly or mass.     Tenderness: There is no abdominal tenderness.     Hernia: No hernia is present.  Musculoskeletal:     Cervical back: Neck supple.     Right lower leg: No edema.     Left lower leg: No edema.  Skin:    Findings: No rash.  Neurological:     Mental Status: He is alert and oriented to person, place, and time.     Motor: No weakness.  Psychiatric:        Mood and Affect: Mood normal.        Behavior: Behavior normal.     BP 115/66   Pulse 72   Ht 6' (1.829 m)   Wt 231 lb 1.6 oz (104.8 kg)   BMI 31.34 kg/m  Wt Readings from Last 3 Encounters:  09/05/20 231 lb 1.6 oz (104.8 kg)  08/29/20 231 lb 1.6 oz (104.8 kg)  06/25/20 237 lb 8 oz (107.7 kg)     Health Maintenance Due  Topic Date Due  . Hepatitis C Screening  Never done  . TETANUS/TDAP  Never done  . PNA vac Low Risk Adult (1 of 2 - PCV13) Never done    There are no preventive care reminders to display for this patient.  Lab Results  Component Value Date   TSH 3.10 06/19/2020   Lab Results  Component Value Date   WBC 4.4 08/29/2020   HGB 7.9 (L) 08/29/2020    HCT 24.6 (L) 08/29/2020   MCV 97.2 08/29/2020   PLT 301 08/29/2020  Lab Results  Component Value Date   NA 140 06/19/2020   K 4.4 06/19/2020   CO2 26 06/19/2020   GLUCOSE 79 06/19/2020   BUN 23 06/19/2020   CREATININE 1.09 06/19/2020   BILITOT 0.7 06/19/2020   ALKPHOS 64 04/29/2018   AST 15 06/19/2020   ALT 17 06/19/2020   PROT 6.5 06/19/2020   ALBUMIN 4.7 04/29/2018   CALCIUM 9.7 06/19/2020   ANIONGAP 6 03/11/2018   Lab Results  Component Value Date   CHOL 159 06/19/2020   Lab Results  Component Value Date   HDL 54 06/19/2020   Lab Results  Component Value Date   LDLCALC 88 06/19/2020   Lab Results  Component Value Date   TRIG 78 06/19/2020   Lab Results  Component Value Date   CHOLHDL 2.9 06/19/2020   Lab Results  Component Value Date   HGBA1C 5.7 (H) 03/11/2018      Assessment & Plan:   Problem List Items Addressed This Visit      Cardiovascular and Mediastinum   Essential hypertension    - Today, the patient's blood pressure is well managed on med. - The patient will continue the current treatment regimen.  - I encouraged the patient to eat a low-sodium diet to help control blood pressure. - I encouraged the patient to live an active lifestyle and complete activities that increases heart rate to 85% target heart rate at least 5 times per week for one hour.         Coronary artery disease, non-occlusive - Primary    stable      Relevant Orders   EKG 12-Lead     Respiratory   Seasonal allergic rhinitis due to pollen    Take claritin  As needed        Digestive   Gastrointestinal hemorrhage    Patient condition is stable.  GI bleed.  Is stable.       Electrocardiogram report.  Sinus bradycardia, no acute changes were noted.  No orders of the defined types were placed in this encounter.   Follow-up: No follow-ups on file.    Cletis Athens, MD

## 2020-09-05 NOTE — Assessment & Plan Note (Signed)
-   Today, the patient's blood pressure is well managed on med. - The patient will continue the current treatment regimen.  - I encouraged the patient to eat a low-sodium diet to help control blood pressure. - I encouraged the patient to live an active lifestyle and complete activities that increases heart rate to 85% target heart rate at least 5 times per week for one hour.     

## 2020-09-05 NOTE — Assessment & Plan Note (Signed)
stable °

## 2020-09-06 ENCOUNTER — Ambulatory Visit: Payer: Self-pay | Admitting: Urology

## 2020-09-19 ENCOUNTER — Other Ambulatory Visit: Payer: Self-pay

## 2020-09-19 ENCOUNTER — Encounter: Payer: Self-pay | Admitting: Urology

## 2020-09-19 ENCOUNTER — Ambulatory Visit: Payer: Medicare Other | Admitting: Urology

## 2020-09-19 VITALS — BP 114/67 | HR 74 | Ht 72.0 in | Wt 231.0 lb

## 2020-09-19 DIAGNOSIS — N3281 Overactive bladder: Secondary | ICD-10-CM | POA: Diagnosis not present

## 2020-09-19 DIAGNOSIS — Z8546 Personal history of malignant neoplasm of prostate: Secondary | ICD-10-CM

## 2020-09-19 LAB — URINALYSIS, COMPLETE
Bilirubin, UA: NEGATIVE
Glucose, UA: NEGATIVE
Ketones, UA: NEGATIVE
Leukocytes,UA: NEGATIVE
Nitrite, UA: NEGATIVE
Protein,UA: NEGATIVE
RBC, UA: NEGATIVE
Specific Gravity, UA: >1.03 — ABNORMAL HIGH (ref 1.005–1.030)
Urobilinogen, Ur: 1 mg/dL (ref 0.2–1.0)
pH, UA: 5 (ref 5.0–7.5)

## 2020-09-19 LAB — MICROSCOPIC EXAMINATION

## 2020-09-19 NOTE — Progress Notes (Signed)
09/19/2020 11:20 AM   Darren Alu Sr. 02/24/1946 [id number]  Referring provider: Cletis Athens, MD [address] [place],  [place] [address]  Chief Complaint  Patient presents with  . Over Active Bladder    Urologic History:  1. Prostate Cancer - Radical prostatectomy at [facility] in 08/2003 - PSA remains undetectable; Patient elected to continue PSA surveillance  2. Urinary Frequency/Urgency - Toviaz 4 mg  3. ED - Previously on Edex - Not sexually active at this time    HPI: 75 y.o. male presents for annual follow-up.   Since last years visit he has been diagnosed with thyroid cancer and is scheduled for surgery at [facility] in the near future  Remains on [place] with intermittent urgency  PSA 06/2020 <0.04   PMH: Past Medical History:  Diagnosis Date  . Arthritis   . Atrial fibrillation ([place])   . GERD (gastroesophageal reflux disease)   . History of prostate cancer   . Hyperlipidemia 09/09/2018  . Personal history of prostate cancer 08/26/2017  . Sciatica    bilateral, intermittent  . Sleep apnea    CPAP    Surgical History: Past Surgical History:  Procedure Laterality Date  . COLONOSCOPY WITH PROPOFOL N/A 01/24/2019   Procedure: COLONOSCOPY WITH BIOPSY;  Surgeon: Lucilla Lame, MD;  Location: [facility];  Service: Endoscopy;  Laterality: N/A;  sleep apnea  . KNEE ARTHROSCOPY    . NOSE SURGERY    . POLYPECTOMY N/A 01/24/2019   Procedure: POLYPECTOMY;  Surgeon: Lucilla Lame, MD;  Location: [facility];  Service: Endoscopy;  Laterality: N/A;  . PROSTATECTOMY    . SHOULDER ARTHROSCOPY    . WISDOM TOOTH EXTRACTION      Home Medications:  Allergies as of 09/19/2020   No Known Allergies     Medication List       Accurate as of September 19, 2020 11:20 AM. If you have any questions, ask your nurse or doctor.        STOP taking these medications   loratadine 10 MG  tablet Commonly known as: CLARITIN Stopped by: Abbie Sons, MD   nitroGLYCERIN 0.3 MG SL tablet Commonly known as: NITROSTAT Stopped by: Abbie Sons, MD   OSTEO BI-FLEX REGULAR STRENGTH PO Stopped by: Abbie Sons, MD   VITAMIN C PO Stopped by: Abbie Sons, MD     TAKE these medications   ASPIRIN 81 PO Take by mouth daily.   atenolol 50 MG tablet Commonly known as: TENORMIN TAKE 1 TABLET DAILY   Cholecalciferol 25 MCG (1000 UT) tablet Take by mouth.   citalopram 20 MG tablet Commonly known as: CELEXA Take by mouth.   fenofibrate 160 MG tablet Take by mouth every other day.   fluticasone 50 MCG/ACT nasal spray Commonly known as: FLONASE Place 2 sprays into both nostrils daily.   melatonin 5 MG Tabs Take by mouth at bedtime as needed.   multivitamin capsule Take by mouth.   omeprazole 40 MG capsule Commonly known as: PRILOSEC Take 40 mg by mouth daily as needed.   prazosin 1 MG capsule Commonly known as: MINIPRESS Take 3 mg by mouth at bedtime.   rosuvastatin 10 MG tablet Commonly known as: Crestor Take 1 tablet (10 mg total) by mouth daily.   Toviaz 4 MG Tb24 tablet Generic drug: fesoterodine TAKE 1 TABLET DAILY   tretinoin 0.05 % cream Commonly known as: RETIN-A       Allergies: No Known Allergies  Family History:  Family History  Problem Relation Age of Onset  . Prostate cancer Neg Hx   . Bladder Cancer Neg Hx   . Kidney cancer Neg Hx     Social History:  reports that he has never smoked. He has never used smokeless tobacco. He reports that he does not drink alcohol and does not use drugs.   Physical Exam: BP 114/67   Pulse 74   Ht 6' (1.829 m)   Wt 231 lb (104.8 kg)   BMI 31.33 kg/m   Constitutional:  Alert and oriented, No acute distress. HEENT: [place] AT, moist mucus membranes.  Trachea midline, no masses. Cardiovascular: No clubbing, cyanosis, or edema. Respiratory: Normal respiratory effort, no increased work of  breathing.  Laboratory Data:  Lab Results  Component Value Date   PSA <0.04 06/19/2020   Urinalysis Dipstick/microscopy negative   Assessment & Plan:    1. Overactive bladder  Urgency bothersome at times  Titrate Toviaz 8 mg; he was given 4 weeks worth of samples and will call back if efficacy is improved and he does not have significant side effects  2.  History of prostate cancer  PSA remains undetectable  Continue annual follow-up   Abbie Sons, MD  [place] [facility] [address], [place], [place] [address] [phone]

## 2020-09-26 ENCOUNTER — Ambulatory Visit (INDEPENDENT_AMBULATORY_CARE_PROVIDER_SITE_OTHER): Payer: Medicare Other | Admitting: Internal Medicine

## 2020-09-26 ENCOUNTER — Encounter: Payer: Self-pay | Admitting: Internal Medicine

## 2020-09-26 ENCOUNTER — Other Ambulatory Visit: Payer: Self-pay

## 2020-09-26 VITALS — BP 119/70 | HR 62 | Ht 72.0 in | Wt 234.1 lb

## 2020-09-26 DIAGNOSIS — D509 Iron deficiency anemia, unspecified: Secondary | ICD-10-CM

## 2020-09-26 DIAGNOSIS — E785 Hyperlipidemia, unspecified: Secondary | ICD-10-CM

## 2020-09-26 DIAGNOSIS — I10 Essential (primary) hypertension: Secondary | ICD-10-CM

## 2020-09-26 DIAGNOSIS — N3281 Overactive bladder: Secondary | ICD-10-CM

## 2020-09-26 DIAGNOSIS — C73 Malignant neoplasm of thyroid gland: Secondary | ICD-10-CM

## 2020-09-26 DIAGNOSIS — M461 Sacroiliitis, not elsewhere classified: Secondary | ICD-10-CM

## 2020-09-26 HISTORY — DX: Malignant neoplasm of thyroid gland: C73

## 2020-09-26 LAB — CBC WITH DIFFERENTIAL/PLATELET
Absolute Monocytes: 549 cells/uL (ref 200–950)
Basophils Absolute: 29 cells/uL (ref 0–200)
Basophils Relative: 0.6 %
Eosinophils Absolute: 201 cells/uL (ref 15–500)
Eosinophils Relative: 4.1 %
HCT: 36.1 % — ABNORMAL LOW (ref 38.5–50.0)
Hemoglobin: 11.9 g/dL — ABNORMAL LOW (ref 13.2–17.1)
Lymphs Abs: 1289 cells/uL (ref 850–3900)
MCH: 31.1 pg (ref 27.0–33.0)
MCHC: 33 g/dL (ref 32.0–36.0)
MCV: 94.3 fL (ref 80.0–100.0)
MPV: 11.2 fL (ref 7.5–12.5)
Monocytes Relative: 11.2 %
Neutro Abs: 2832 cells/uL (ref 1500–7800)
Neutrophils Relative %: 57.8 %
Platelets: 228 10*3/uL (ref 140–400)
RBC: 3.83 10*6/uL — ABNORMAL LOW (ref 4.20–5.80)
RDW: 13 % (ref 11.0–15.0)
Total Lymphocyte: 26.3 %
WBC: 4.9 10*3/uL (ref 3.8–10.8)

## 2020-09-26 NOTE — Progress Notes (Signed)
Established Patient Office Visit  Subjective:  Patient ID: Darren SOTER Sr., male    DOB: 19-Feb-1946  Age: 75 y.o. MRN: [id number]  CC:  Chief Complaint  Patient presents with  . Follow-up    HPI  Darren CRISOSTOMO Sr. presents for checkup on anemia.  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  He will also need radioactive iodine therapy.  Patient complains of increased gas in the abdomen so I told him to provide onion and garlic lentils and kidney beans avoid [place] and fried food.  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  Patient will receive a total thyroidectomy.  Past Medical History:  Diagnosis Date  . Arthritis   . Atrial fibrillation ([place])   . GERD (gastroesophageal reflux disease)   . History of prostate cancer   . Hyperlipidemia 09/09/2018  . Personal history of prostate cancer 08/26/2017  . Sciatica    bilateral, intermittent  . Sleep apnea    CPAP    Past Surgical History:  Procedure Laterality Date  . COLONOSCOPY WITH PROPOFOL N/A 01/24/2019   Procedure: COLONOSCOPY WITH BIOPSY;  Surgeon: Lucilla Lame, MD;  Location: [facility];  Service: Endoscopy;  Laterality: N/A;  sleep apnea  . KNEE ARTHROSCOPY    . NOSE SURGERY    . POLYPECTOMY N/A 01/24/2019   Procedure: POLYPECTOMY;  Surgeon: Lucilla Lame, MD;  Location: [facility];  Service: Endoscopy;  Laterality: N/A;  . PROSTATECTOMY    . SHOULDER ARTHROSCOPY    . WISDOM TOOTH EXTRACTION      Family History  Problem Relation Age of Onset  . Prostate cancer Neg Hx   . Bladder Cancer Neg Hx   . Kidney cancer Neg Hx     Social History   Socioeconomic History  . Marital status: Married    Spouse name: Not on file  . Number of children: Not on file  . Years of education: Not on file  . Highest education level: Not on file  Occupational History  . Not on file  Tobacco Use  . Smoking status: Never  Smoker  . Smokeless tobacco: Never Used  Vaping Use  . Vaping Use: Never used  Substance and Sexual Activity  . Alcohol use: No    Comment: may have drink 5x/yr  . Drug use: No  . Sexual activity: Yes    Birth control/protection: None  Other Topics Concern  . Not on file  Social History Narrative  . Not on file   Social Determinants of Health   Financial Resource Strain: Not on file  Food Insecurity: Not on file  Transportation Needs: Not on file  Physical Activity: Not on file  Stress: Not on file  Social Connections: Not on file  Intimate Partner Violence: Not on file     Current Outpatient Medications:  .  atenolol (TENORMIN) 50 MG tablet, TAKE 1 TABLET DAILY, Disp: 90 tablet, Rfl: 3 .  Cholecalciferol 25 MCG (1000 UT) tablet, Take by mouth., Disp: , Rfl:  .  citalopram (CELEXA) 20 MG tablet, Take by mouth., Disp: , Rfl:  .  fenofibrate 160 MG tablet, Take by mouth every other day. , Disp: , Rfl:  .  Melatonin 5 MG TABS, Take by mouth at bedtime as needed., Disp: , Rfl:  .  Multiple Vitamin (MULTIVITAMIN) capsule, Take by mouth., Disp: , Rfl:  .  omeprazole (  PRILOSEC) 40 MG capsule, Take 40 mg by mouth daily as needed. , Disp: , Rfl:  .  prazosin (MINIPRESS) 1 MG capsule, Take 3 mg by mouth at bedtime. , Disp: , Rfl:  .  rosuvastatin (CRESTOR) 10 MG tablet, Take 1 tablet (10 mg total) by mouth daily., Disp: 90 tablet, Rfl: 3 .  TOVIAZ 4 MG TB24 tablet, TAKE 1 TABLET DAILY, Disp: 90 tablet, Rfl: 3 .  tretinoin (RETIN-A) 0.05 % cream, , Disp: , Rfl:  .  ASPIRIN 81 PO, Take by mouth daily., Disp: , Rfl:  .  fluticasone (FLONASE) 50 MCG/ACT nasal spray, Place 2 sprays into both nostrils daily., Disp: 16 g, Rfl: 6   No Known Allergies  ROS Review of Systems  Constitutional: Negative.   HENT: Positive for sinus pressure.   Eyes: Negative.   Respiratory: Negative.   Cardiovascular: Negative.   Gastrointestinal: Negative.   Endocrine: Negative.   Genitourinary:  Negative.   Musculoskeletal: Negative.   Skin: Negative.   Allergic/Immunologic: Negative.   Neurological: Negative.   Hematological: Negative.   Psychiatric/Behavioral: Negative.   All other systems reviewed and are negative.     Objective:    Physical Exam   Constitutional: The patient is oriented to person, place, and time. Pt appears well-developed and well-nourished.  Head: Normocephalic and atraumatic.  Eyes: Pupils are equal, round, and reactive to light.  Neck: No JVD present. No tracheal deviation present. No thyromegaly present.  Cardiovascular: Regular rate and rhythm. No gallop. Pulmonary/Chest: Normal breath sounds. Lungs clear to auscultation. Abdominal: No abdominal tenderness. No guarding or rebound tenderness. No hepatosplenomegaly. Musculoskeletal: Normal range of motion.  Lymphatic: No cervical adenopathy.  Neurological: No cranial nerve deficit.  Skin: Skin is warm and hydrated.  Psychiatric: The patient has a normal mood and affect.   BP 119/70   Pulse 62   Ht 6' (1.829 m)   Wt 234 lb 1.6 oz (106.2 kg)   BMI 31.75 kg/m  Wt Readings from Last 3 Encounters:  09/26/20 234 lb 1.6 oz (106.2 kg)  09/19/20 231 lb (104.8 kg)  09/05/20 231 lb 1.6 oz (104.8 kg)     Health Maintenance Due  Topic Date Due  . Hepatitis C Screening  Never done  . TETANUS/TDAP  Never done  . PNA vac Low Risk Adult (1 of 2 - PCV13) Never done    There are no preventive care reminders to display for this patient.  Lab Results  Component Value Date   TSH 3.10 06/19/2020   Lab Results  Component Value Date   WBC 4.4 08/29/2020   HGB 7.9 (L) 08/29/2020   HCT 24.6 (L) 08/29/2020   MCV 97.2 08/29/2020   PLT 301 08/29/2020   Lab Results  Component Value Date   NA 140 06/19/2020   K 4.4 06/19/2020   CO2 26 06/19/2020   GLUCOSE 79 06/19/2020   BUN 23 06/19/2020   CREATININE 1.09 06/19/2020   BILITOT 0.7 06/19/2020   ALKPHOS 64 04/29/2018   AST 15 06/19/2020   ALT 17  06/19/2020   PROT 6.5 06/19/2020   ALBUMIN 4.7 04/29/2018   CALCIUM 9.7 06/19/2020   ANIONGAP 6 03/11/2018   Lab Results  Component Value Date   CHOL 159 06/19/2020   Lab Results  Component Value Date   HDL 54 06/19/2020   Lab Results  Component Value Date   LDLCALC 88 06/19/2020   Lab Results  Component Value Date   TRIG 78 06/19/2020  Lab Results  Component Value Date   CHOLHDL 2.9 06/19/2020   Lab Results  Component Value Date   HGBA1C 5.7 (H) 03/11/2018      Assessment & Plan:   Problem List Items Addressed This Visit      Cardiovascular and Mediastinum   Essential hypertension    Blood pressure is stable on the present medication.        Endocrine   Thyroid cancer [facility])    Patient has a papillary thyroid cancer.  It was proven by biopsy he is going to have a total thyroid surgery followed up by radiation treatment with iodine-131.  He was told that he will need to take Levoxyl for the rest of his life        Musculoskeletal and Integument   Sacroiliitis ([place])    It is stable at the present time.        Genitourinary   Overactive bladder    Patient was referred to neurologist.        Other   Hyperlipidemia    Patient was advised to continue using Crestor 10 mg p.o. daily      Iron deficiency anemia - Primary    We'll check the patient's CBC today is not bleeding anymore.      Relevant Orders   CBC with Differential/Platelet      No orders of the defined types were placed in this encounter.   Follow-up: No follow-ups on file.    Cletis Athens, MD

## 2020-09-26 NOTE — Assessment & Plan Note (Signed)
Patient has a papillary thyroid cancer.  It was proven by biopsy he is going to have a total thyroid surgery followed up by radiation treatment with iodine-131.  He was told that he will need to take Levoxyl for the rest of his life

## 2020-09-26 NOTE — Assessment & Plan Note (Signed)
We'll check the patient's CBC today is not bleeding anymore.

## 2020-09-26 NOTE — Assessment & Plan Note (Signed)
Patient was referred to neurologist.

## 2020-09-26 NOTE — Assessment & Plan Note (Signed)
Blood pressure is stable on the present medication 

## 2020-09-26 NOTE — Assessment & Plan Note (Signed)
Patient was advised to continue using Crestor 10 mg p.o. daily

## 2020-09-26 NOTE — Assessment & Plan Note (Signed)
It is stable at the present time °

## 2020-11-06 ENCOUNTER — Other Ambulatory Visit: Payer: Self-pay | Admitting: Internal Medicine

## 2020-11-07 ENCOUNTER — Other Ambulatory Visit: Payer: Self-pay | Admitting: Internal Medicine

## 2020-11-27 ENCOUNTER — Encounter: Payer: Self-pay | Admitting: Internal Medicine

## 2020-11-27 ENCOUNTER — Other Ambulatory Visit: Payer: Self-pay

## 2020-11-27 ENCOUNTER — Ambulatory Visit: Payer: Medicare Other | Admitting: Internal Medicine

## 2020-11-27 VITALS — BP 104/62 | HR 78 | Ht 72.0 in | Wt 226.4 lb

## 2020-11-27 DIAGNOSIS — D649 Anemia, unspecified: Secondary | ICD-10-CM | POA: Diagnosis not present

## 2020-11-27 DIAGNOSIS — M461 Sacroiliitis, not elsewhere classified: Secondary | ICD-10-CM

## 2020-11-27 DIAGNOSIS — C73 Malignant neoplasm of thyroid gland: Secondary | ICD-10-CM | POA: Diagnosis not present

## 2020-11-27 DIAGNOSIS — I10 Essential (primary) hypertension: Secondary | ICD-10-CM | POA: Diagnosis not present

## 2020-11-27 DIAGNOSIS — D509 Iron deficiency anemia, unspecified: Secondary | ICD-10-CM

## 2020-11-27 DIAGNOSIS — N3281 Overactive bladder: Secondary | ICD-10-CM

## 2020-11-27 LAB — B12 AND FOLATE PANEL
Folate: >24 ng/mL
Vitamin B-12: 1091 pg/mL (ref 200–1100)

## 2020-12-06 ENCOUNTER — Encounter: Payer: Self-pay | Admitting: Internal Medicine

## 2020-12-06 DIAGNOSIS — D649 Anemia, unspecified: Secondary | ICD-10-CM | POA: Insufficient documentation

## 2020-12-06 NOTE — Assessment & Plan Note (Signed)
Refer to oncology

## 2020-12-06 NOTE — Progress Notes (Signed)
Established Patient Office Visit  Subjective:  Patient ID: Darren MUHAMMED Sr., male    DOB: April 02, 1946  Age: 75 y.o. MRN: [id number]  CC:  Chief Complaint  Patient presents with  . Follow-up    HPI  Darren SNAPP Sr. 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  With hypertrophy of the facet no acute fracture was noted.  Past Medical History:  Diagnosis Date  . Arthritis   . Atrial fibrillation ([place])   . GERD (gastroesophageal reflux disease)   . History of prostate cancer   . Hyperlipidemia 09/09/2018  . Personal history of prostate cancer 08/26/2017  . Sciatica    bilateral, intermittent  . Sleep apnea    CPAP    Past Surgical History:  Procedure Laterality Date  . COLONOSCOPY WITH PROPOFOL N/A 01/24/2019   Procedure: COLONOSCOPY WITH BIOPSY;  Surgeon: Lucilla Lame, MD;  Location: [place];  Service: Endoscopy;  Laterality: N/A;  sleep apnea  . KNEE ARTHROSCOPY    . NOSE SURGERY    . POLYPECTOMY N/A 01/24/2019   Procedure: POLYPECTOMY;  Surgeon: Lucilla Lame, MD;  Location: [place];  Service: Endoscopy;  Laterality: N/A;  . PROSTATECTOMY    . SHOULDER ARTHROSCOPY    . WISDOM TOOTH EXTRACTION      Family History  Problem Relation Age of Onset  . Prostate cancer Neg Hx   . Bladder Cancer Neg Hx   . Kidney cancer Neg Hx     Social History   Socioeconomic History  . Marital status: Married    Spouse name: Not on file  . Number of children: Not on file  . Years of education: Not on file  . Highest education level: Not on file  Occupational History  . Not on file  Tobacco Use  . Smoking status: Never Smoker  . Smokeless tobacco: Never Used  Vaping Use  . Vaping Use: Never used  Substance and Sexual Activity  . Alcohol use: No    Comment: may have drink 5x/yr  . Drug use: No  . Sexual activity: Yes    Birth control/protection: None  Other Topics Concern   . Not on file  Social History Narrative  . Not on file   Social Determinants of Health   Financial Resource Strain: Not on file  Food Insecurity: Not on file  Transportation Needs: Not on file  Physical Activity: Not on file  Stress: Not on file  Social Connections: Not on file  Intimate Partner Violence: Not on file     Current Outpatient Medications:  .  atenolol (TENORMIN) 50 MG tablet, TAKE 1 TABLET DAILY, Disp: 90 tablet, Rfl: 3 .  Cholecalciferol 25 MCG (1000 UT) tablet, Take by mouth., Disp: , Rfl:  .  citalopram (CELEXA) 20 MG tablet, TAKE 3 TABLETS DAILY, Disp: 270 tablet, Rfl: 3 .  fenofibrate 160 MG tablet, TAKE 1 TABLET DAILY, Disp: 90 tablet, Rfl: 3 .  Melatonin 5 MG TABS, Take by mouth at bedtime as needed., Disp: , Rfl:  .  Multiple Vitamin (MULTIVITAMIN) capsule, Take by mouth., Disp: , Rfl:  .  omeprazole (PRILOSEC) 40 MG capsule, Take 40 mg by mouth daily as needed. , Disp: , Rfl:  .  prazosin (MINIPRESS) 1 MG capsule, TAKE 3 CAPSULES AT BEDTIME, Disp: 270 capsule, Rfl: 3 .  rosuvastatin (CRESTOR) 10 MG tablet, Take 1 tablet (10 mg total) by mouth daily., Disp: 90  tablet, Rfl: 3 .  TOVIAZ 4 MG TB24 tablet, TAKE 1 TABLET DAILY, Disp: 90 tablet, Rfl: 3 .  tretinoin (RETIN-A) 0.05 % cream, , Disp: , Rfl:  .  fluticasone (FLONASE) 50 MCG/ACT nasal spray, Place 2 sprays into both nostrils daily., Disp: 16 g, Rfl: 6   No Known Allergies  ROS Review of Systems  Constitutional: Negative.   HENT: Negative.   Eyes: Negative.   Respiratory: Negative.   Cardiovascular: Negative.   Gastrointestinal: Negative.   Endocrine: Negative.   Genitourinary: Negative.   Musculoskeletal: Negative.   Skin: Negative.   Allergic/Immunologic: Negative.   Neurological: Negative.   Hematological: Negative.   Psychiatric/Behavioral: Negative.   All other systems reviewed and are negative.     Objective:    Physical Exam Vitals reviewed.  Constitutional:      Appearance:  Normal appearance.  HENT:     Mouth/Throat:     Mouth: Mucous membranes are moist.  Eyes:     Pupils: Pupils are equal, round, and reactive to light.  Neck:     Vascular: No carotid bruit.  Cardiovascular:     Rate and Rhythm: Normal rate and regular rhythm.     Pulses: Normal pulses.     Heart sounds: Normal heart sounds.  Pulmonary:     Effort: Pulmonary effort is normal.     Breath sounds: Normal breath sounds.  Abdominal:     General: Bowel sounds are normal.     Palpations: Abdomen is soft. There is no hepatomegaly, splenomegaly or mass.     Tenderness: There is no abdominal tenderness.     Hernia: No hernia is present.  Musculoskeletal:     Cervical back: Neck supple.     Right lower leg: No edema.     Left lower leg: No edema.  Skin:    Findings: No rash.  Neurological:     Mental Status: He is alert and oriented to person, place, and time.     Motor: No weakness.  Psychiatric:        Mood and Affect: Mood normal.        Behavior: Behavior normal.     BP 104/62   Pulse 78   Ht 6' (1.829 m)   Wt 226 lb 6.4 oz (102.7 kg)   BMI 30.71 kg/m  Wt Readings from Last 3 Encounters:  11/27/20 226 lb 6.4 oz (102.7 kg)  09/26/20 234 lb 1.6 oz (106.2 kg)  09/19/20 231 lb (104.8 kg)     Health Maintenance Due  Topic Date Due  . Hepatitis C Screening  Never done  . TETANUS/TDAP  Never done  . PNA vac Low Risk Adult (1 of 2 - PCV13) Never done    There are no preventive care reminders to display for this patient.  Lab Results  Component Value Date   TSH 3.10 06/19/2020   Lab Results  Component Value Date   WBC 4.9 09/26/2020   HGB 11.9 (L) 09/26/2020   HCT 36.1 (L) 09/26/2020   MCV 94.3 09/26/2020   PLT 228 09/26/2020   Lab Results  Component Value Date   NA 140 06/19/2020   K 4.4 06/19/2020   CO2 26 06/19/2020   GLUCOSE 79 06/19/2020   BUN 23 06/19/2020   CREATININE 1.09 06/19/2020   BILITOT 0.7 06/19/2020   ALKPHOS 64 04/29/2018   AST 15  06/19/2020   ALT 17 06/19/2020   PROT 6.5 06/19/2020   ALBUMIN 4.7 04/29/2018   CALCIUM  9.7 06/19/2020   ANIONGAP 6 03/11/2018   Lab Results  Component Value Date   CHOL 159 06/19/2020   Lab Results  Component Value Date   HDL 54 06/19/2020   Lab Results  Component Value Date   LDLCALC 88 06/19/2020   Lab Results  Component Value Date   TRIG 78 06/19/2020   Lab Results  Component Value Date   CHOLHDL 2.9 06/19/2020   Lab Results  Component Value Date   HGBA1C 5.7 (H) 03/11/2018      Assessment & Plan:   Problem List Items Addressed This Visit      Cardiovascular and Mediastinum   Essential hypertension    Patient blood pressure is normal patient denies any chest pain or shortness of breath there is no history of palpitation or paroxysmal nocturnal dyspnea   patient was advised to follow low-salt low-cholesterol diet    ideally I want to keep systolic blood pressure below 130 mmHg, patient was asked to check blood pressure one times a week and give me a report on that.  Patient will be follow-up in 3 months  or earlier as needed, patient will call me back for any change in the cardiovascular symptoms           Endocrine   Thyroid cancer [facility])    Refer to oncology        Musculoskeletal and Integument   Sacroiliitis ([place])    Refer to back surgeon.        Genitourinary   Overactive bladder    Chronic problem        Other   Iron deficiency anemia    Hematocrit 36.1.      Anemia - Primary    Stable at present time      Relevant Orders   B12 and Folate Panel (Completed)      No orders of the defined types were placed in this encounter.   Follow-up: No follow-ups on file.    Cletis Athens, MD

## 2020-12-06 NOTE — Assessment & Plan Note (Signed)
Refer to back surgeon.

## 2020-12-06 NOTE — Assessment & Plan Note (Signed)
Chronic problem. 

## 2020-12-06 NOTE — Assessment & Plan Note (Signed)
Stable at present time

## 2020-12-06 NOTE — Assessment & Plan Note (Signed)
Patient blood pressure is normal patient denies any chest pain or shortness of breath there is no history of palpitation or paroxysmal nocturnal dyspnea   patient was advised to follow low-salt low-cholesterol diet    ideally I want to keep systolic blood pressure below 130 mmHg, patient was asked to check blood pressure one times a week and give me a report on that.  Patient will be follow-up in 3 months  or earlier as needed, patient will call me back for any change in the cardiovascular symptoms    

## 2020-12-06 NOTE — Assessment & Plan Note (Signed)
Hematocrit 36.1.

## 2021-02-08 DIAGNOSIS — M47816 Spondylosis without myelopathy or radiculopathy, lumbar region: Secondary | ICD-10-CM | POA: Insufficient documentation

## 2021-02-14 ENCOUNTER — Other Ambulatory Visit: Payer: Self-pay | Admitting: Internal Medicine

## 2021-03-04 ENCOUNTER — Ambulatory Visit: Payer: Medicare Other | Admitting: Internal Medicine

## 2021-03-04 ENCOUNTER — Encounter: Payer: Self-pay | Admitting: Internal Medicine

## 2021-03-04 ENCOUNTER — Other Ambulatory Visit: Payer: Self-pay

## 2021-03-04 VITALS — BP 125/72 | HR 65 | Ht 72.0 in | Wt 232.3 lb

## 2021-03-04 DIAGNOSIS — E7849 Other hyperlipidemia: Secondary | ICD-10-CM

## 2021-03-04 DIAGNOSIS — I10 Essential (primary) hypertension: Secondary | ICD-10-CM | POA: Diagnosis not present

## 2021-03-04 DIAGNOSIS — M461 Sacroiliitis, not elsewhere classified: Secondary | ICD-10-CM

## 2021-03-04 DIAGNOSIS — N3281 Overactive bladder: Secondary | ICD-10-CM

## 2021-03-04 DIAGNOSIS — K920 Hematemesis: Secondary | ICD-10-CM

## 2021-03-04 DIAGNOSIS — C73 Malignant neoplasm of thyroid gland: Secondary | ICD-10-CM | POA: Diagnosis not present

## 2021-03-04 DIAGNOSIS — J301 Allergic rhinitis due to pollen: Secondary | ICD-10-CM | POA: Diagnosis not present

## 2021-03-04 DIAGNOSIS — I480 Paroxysmal atrial fibrillation: Secondary | ICD-10-CM | POA: Insufficient documentation

## 2021-03-04 NOTE — Assessment & Plan Note (Signed)
Electrocardiogram was performed which revealed normal sinus rhythm no acute changes were noted.

## 2021-03-04 NOTE — Assessment & Plan Note (Signed)
Take Claritin 5 mg daily as needed 

## 2021-03-04 NOTE — Assessment & Plan Note (Signed)

## 2021-03-04 NOTE — Assessment & Plan Note (Signed)
Half of the polyp Has Been Removed, He Is Being Followed by ENT Specialist and they Will Decide When to Remove the Other Half.

## 2021-03-04 NOTE — Assessment & Plan Note (Signed)
Patient has an endoscopy.  He has a stricture of the esophagus.  Patient has some areas of polyp in the stomach and it should be followed up in [facility] gastroenterology department.  And I encouraged him to be following with them on a regular basis.

## 2021-03-04 NOTE — Assessment & Plan Note (Signed)
Continue Toviaz and Minipress.

## 2021-03-04 NOTE — Assessment & Plan Note (Signed)
Hypercholesterolemia  I advised the patient to follow Mediterranean diet This diet is rich in fruits vegetables and whole grain, and This diet is also rich in fish and lean meat Patient should also eat a handful of almonds or walnuts daily Recent heart study indicated that average follow-up on this kind of diet reduces the cardiovascular mortality by 50 to 70%== 

## 2021-03-04 NOTE — Progress Notes (Signed)
Established Patient Office Visit  Subjective:  Patient ID: Darren ARBUCKLE Sr., male    DOB: 1945-12-20  Age: 75 y.o. MRN: [id number]  CC:  Chief Complaint  Patient presents with   Follow-up    HPI  Darren FORET Sr. presents for bp check up, back pain , getting nerve blocks.  Past Medical History:  Diagnosis Date   Arthritis    Atrial fibrillation (HCC)    GERD (gastroesophageal reflux disease)    History of prostate cancer    Hyperlipidemia 09/09/2018   Personal history of prostate cancer 08/26/2017   Sciatica    bilateral, intermittent   Sleep apnea    CPAP    Past Surgical History:  Procedure Laterality Date   COLONOSCOPY WITH PROPOFOL N/A 01/24/2019   Procedure: COLONOSCOPY WITH BIOPSY;  Surgeon: Lucilla Lame, MD;  Location: [place];  Service: Endoscopy;  Laterality: N/A;  sleep apnea   KNEE ARTHROSCOPY     NOSE SURGERY     POLYPECTOMY N/A 01/24/2019   Procedure: POLYPECTOMY;  Surgeon: Lucilla Lame, MD;  Location: [place];  Service: Endoscopy;  Laterality: N/A;   PROSTATECTOMY     SHOULDER ARTHROSCOPY     WISDOM TOOTH EXTRACTION      Family History  Problem Relation Age of Onset   Prostate cancer Neg Hx    Bladder Cancer Neg Hx    Kidney cancer Neg Hx     Social History   Socioeconomic History   Marital status: Married    Spouse name: Not on file   Number of children: Not on file   Years of education: Not on file   Highest education level: Not on file  Occupational History   Not on file  Tobacco Use   Smoking status: Never   Smokeless tobacco: Never  Vaping Use   Vaping Use: Never used  Substance and Sexual Activity   Alcohol use: No    Comment: may have drink 5x/yr   Drug use: No   Sexual activity: Yes    Birth control/protection: None  Other Topics Concern   Not on file  Social History Narrative   Not on file   Social Determinants of Health   Financial Resource Strain: Not on file  Food Insecurity: Not on file   Transportation Needs: Not on file  Physical Activity: Not on file  Stress: Not on file  Social Connections: Not on file  Intimate Partner Violence: Not on file     Current Outpatient Medications:    atenolol (TENORMIN) 50 MG tablet, TAKE 1 TABLET DAILY, Disp: 90 tablet, Rfl: 3   Cholecalciferol 25 MCG (1000 UT) tablet, Take by mouth., Disp: , Rfl:    citalopram (CELEXA) 20 MG tablet, TAKE 3 TABLETS DAILY, Disp: 270 tablet, Rfl: 3   fenofibrate 160 MG tablet, TAKE 1 TABLET DAILY, Disp: 90 tablet, Rfl: 3   Ferrous Gluconate-C-Folic Acid (IRON-C PO), Take 65 mg by mouth daily at 2 PM., Disp: , Rfl:    Melatonin 5 MG TABS, Take by mouth at bedtime as needed., Disp: , Rfl:    Multiple Vitamin (MULTIVITAMIN) capsule, Take by mouth., Disp: , Rfl:    prazosin (MINIPRESS) 1 MG capsule, TAKE 3 CAPSULES AT BEDTIME (Patient taking differently: Take 1 mg by mouth 3 (three) times daily.), Disp: 270 capsule, Rfl: 3   rosuvastatin (CRESTOR) 10 MG tablet, Take 1 tablet (10 mg total) by mouth daily., Disp: 90 tablet, Rfl: 3   TOVIAZ 4 MG  TB24 tablet, TAKE 1 TABLET DAILY, Disp: 90 tablet, Rfl: 3   tretinoin (RETIN-A) 0.05 % cream, , Disp: , Rfl:    fluticasone (FLONASE) 50 MCG/ACT nasal spray, Place 2 sprays into both nostrils daily., Disp: 16 g, Rfl: 6   omeprazole (PRILOSEC) 40 MG capsule, Take 40 mg by mouth daily as needed.  (Patient not taking: Reported on 03/04/2021), Disp: , Rfl:    No Known Allergies  ROS Review of Systems  Constitutional:  Positive for fatigue. Negative for diaphoresis.  HENT:  Positive for sneezing. Negative for hearing loss and mouth sores.   Eyes: Negative.   Respiratory: Negative.  Negative for chest tightness.   Cardiovascular:  Positive for palpitations. Negative for chest pain and leg swelling.  Gastrointestinal: Negative.  Negative for abdominal distention and anal bleeding.  Endocrine: Negative.   Genitourinary: Negative.  Negative for flank pain.  Musculoskeletal:  Negative.   Skin: Negative.   Allergic/Immunologic: Negative.   Neurological: Negative.  Negative for light-headedness.  Hematological: Negative.   Psychiatric/Behavioral: Negative.  Negative for agitation and behavioral problems.   All other systems reviewed and are negative.    Objective:    Physical Exam Vitals reviewed.  Constitutional:      Appearance: Normal appearance.  HENT:     Mouth/Throat:     Mouth: Mucous membranes are moist.  Eyes:     Pupils: Pupils are equal, round, and reactive to light.  Neck:     Vascular: No carotid bruit.  Cardiovascular:     Rate and Rhythm: Normal rate and regular rhythm.     Pulses: Normal pulses.     Heart sounds: Normal heart sounds.  Pulmonary:     Effort: Pulmonary effort is normal.     Breath sounds: Normal breath sounds.  Abdominal:     General: Bowel sounds are normal.     Palpations: Abdomen is soft. There is no hepatomegaly, splenomegaly or mass.     Tenderness: There is no abdominal tenderness.     Hernia: No hernia is present.  Musculoskeletal:     Cervical back: Neck supple.     Right lower leg: No edema.     Left lower leg: No edema.  Skin:    Findings: No rash.  Neurological:     Mental Status: He is alert and oriented to person, place, and time.     Motor: No weakness.  Psychiatric:        Mood and Affect: Mood normal.        Behavior: Behavior normal.    BP 125/72   Pulse 65   Ht 6' (1.829 m)   Wt 232 lb 4.8 oz (105.4 kg)   BMI 31.51 kg/m  Wt Readings from Last 3 Encounters:  03/04/21 232 lb 4.8 oz (105.4 kg)  11/27/20 226 lb 6.4 oz (102.7 kg)  09/26/20 234 lb 1.6 oz (106.2 kg)     Health Maintenance Due  Topic Date Due   Hepatitis C Screening  Never done   TETANUS/TDAP  Never done   Zoster Vaccines- Shingrix (1 of 2) Never done   PNA vac Low Risk Adult (1 of 2 - PCV13) Never done   COVID-19 Vaccine (4 - Booster for Pfizer series) 09/18/2020    There are no preventive care reminders to  display for this patient.  Lab Results  Component Value Date   TSH 3.10 06/19/2020   Lab Results  Component Value Date   WBC 4.9 09/26/2020   HGB 11.9 (  L) 09/26/2020   HCT 36.1 (L) 09/26/2020   MCV 94.3 09/26/2020   PLT 228 09/26/2020   Lab Results  Component Value Date   NA 140 06/19/2020   K 4.4 06/19/2020   CO2 26 06/19/2020   GLUCOSE 79 06/19/2020   BUN 23 06/19/2020   CREATININE 1.09 06/19/2020   BILITOT 0.7 06/19/2020   ALKPHOS 64 04/29/2018   AST 15 06/19/2020   ALT 17 06/19/2020   PROT 6.5 06/19/2020   ALBUMIN 4.7 04/29/2018   CALCIUM 9.7 06/19/2020   ANIONGAP 6 03/11/2018   Lab Results  Component Value Date   CHOL 159 06/19/2020   Lab Results  Component Value Date   HDL 54 06/19/2020   Lab Results  Component Value Date   LDLCALC 88 06/19/2020   Lab Results  Component Value Date   TRIG 78 06/19/2020   Lab Results  Component Value Date   CHOLHDL 2.9 06/19/2020   Lab Results  Component Value Date   HGBA1C 5.7 (H) 03/11/2018      Assessment & Plan:  Report of the electrocardiogram.  Electrocardiogram revealed normal sinus rhythm sinus bradycardia Problem List Items Addressed This Visit       Cardiovascular and Mediastinum   Essential hypertension - Primary     Patient denies any chest pain or shortness of breath there is no history of palpitation or paroxysmal nocturnal dyspnea   patient was advised to follow low-salt low-cholesterol diet    ideally I want to keep systolic blood pressure below 130 mmHg, patient was asked to check blood pressure one times a week and give me a report on that.  Patient will be follow-up in 3 months  or earlier as needed, patient will call me back for any change in the cardiovascular symptoms Patient was advised to buy a book from local bookstore concerning blood pressure and read several chapters  every day.  This will be supplemented by some of the material we will give him from the office.  Patient should also  utilize other resources like YouTube and Internet to learn more about the blood pressure and the diet.       Relevant Orders   EKG 12-Lead   Paroxysmal atrial fibrillation (HCC)    Electrocardiogram was performed which revealed normal sinus rhythm no acute changes were noted.         Respiratory   Seasonal allergic rhinitis due to pollen    Take Claritin 5 mg daily as needed         Digestive   Gastrointestinal hemorrhage    Patient has an endoscopy.  He has a stricture of the esophagus.  Patient has some areas of polyp in the stomach and it should be followed up in [facility] gastroenterology department.  And I encouraged him to be following with them on a regular basis.         Endocrine   Thyroid cancer [facility])    Half of the polyp Has Been Removed, He Is Being Followed by ENT Specialist and they Will Decide When to Remove the Other Half.         Musculoskeletal and Integument   Sacroiliitis ([place])     Genitourinary   Overactive bladder    Continue Toviaz and Minipress.         Other   Hyperlipidemia    Hypercholesterolemia  I advised the patient to follow Mediterranean diet This diet is rich in fruits vegetables and whole grain, and This diet  is also rich in fish and lean meat Patient should also eat a handful of almonds or walnuts daily Recent heart study indicated that average follow-up on this kind of diet reduces the cardiovascular mortality by 50 to 70%==        No orders of the defined types were placed in this encounter.   Follow-up: No follow-ups on file.    Cletis Athens, MD

## 2021-04-09 ENCOUNTER — Other Ambulatory Visit: Payer: Self-pay

## 2021-04-19 ENCOUNTER — Ambulatory Visit (INDEPENDENT_AMBULATORY_CARE_PROVIDER_SITE_OTHER): Payer: Medicare Other

## 2021-04-19 DIAGNOSIS — Z0000 Encounter for general adult medical examination without abnormal findings: Secondary | ICD-10-CM

## 2021-04-19 NOTE — Progress Notes (Signed)
Subjective:   Darren DICLEMENTE Sr. is a 75 y.o. male who presents for Medicare Annual/Subsequent preventive examination. Visit done by audio.  Patient location: Home Provider location: Home  Review of Systems    N/A       Objective:    Today's Vitals   04/19/21 1520  PainSc: 6    There is no height or weight on file to calculate BMI.  Advanced Directives 01/24/2019  Does Patient Have a Medical Advance Directive? No  Would patient like information on creating a medical advance directive? No - Patient declined    Current Medications (verified) Outpatient Encounter Medications as of 04/19/2021  Medication Sig   ASPIRIN 81 PO Take 81 mg by mouth daily.   atenolol (TENORMIN) 50 MG tablet TAKE 1 TABLET DAILY   Cholecalciferol 25 MCG (1000 UT) tablet Take by mouth.   citalopram (CELEXA) 20 MG tablet TAKE 3 TABLETS DAILY   DULoxetine (CYMBALTA) 20 MG capsule Take 1 capsule by mouth every morning. Patient states he is taking 40 mg daily   fenofibrate 160 MG tablet TAKE 1 TABLET DAILY   Ferrous Gluconate-C-Folic Acid (IRON-C PO) Take 65 mg by mouth daily at 2 PM.   Melatonin 5 MG TABS Take by mouth at bedtime as needed.   Multiple Vitamin (MULTIVITAMIN) capsule Take by mouth.   prazosin (MINIPRESS) 1 MG capsule TAKE 3 CAPSULES AT BEDTIME (Patient taking differently: Take 1 mg by mouth 3 (three) times daily.)   rosuvastatin (CRESTOR) 10 MG tablet Take 1 tablet (10 mg total) by mouth daily.   TOVIAZ 4 MG TB24 tablet TAKE 1 TABLET DAILY   tretinoin (RETIN-A) 0.05 % cream    [DISCONTINUED] fluticasone (FLONASE) 50 MCG/ACT nasal spray Place 2 sprays into both nostrils daily.   [DISCONTINUED] omeprazole (PRILOSEC) 40 MG capsule Take 40 mg by mouth daily as needed.  (Patient not taking: No sig reported)   No facility-administered encounter medications on file as of 04/19/2021.    Allergies (verified) Patient has no known allergies.   History: Past Medical History:  Diagnosis Date    Arthritis    Atrial fibrillation (HCC)    GERD (gastroesophageal reflux disease)    History of prostate cancer    Hyperlipidemia 09/09/2018   Personal history of prostate cancer 08/26/2017   Sciatica    bilateral, intermittent   Sleep apnea    CPAP   Past Surgical History:  Procedure Laterality Date   COLONOSCOPY WITH PROPOFOL N/A 01/24/2019   Procedure: COLONOSCOPY WITH BIOPSY;  Surgeon: Lucilla Lame, MD;  Location: [facility];  Service: Endoscopy;  Laterality: N/A;  sleep apnea   KNEE ARTHROSCOPY     NOSE SURGERY     POLYPECTOMY N/A 01/24/2019   Procedure: POLYPECTOMY;  Surgeon: Lucilla Lame, MD;  Location: [facility];  Service: Endoscopy;  Laterality: N/A;   PROSTATECTOMY     SHOULDER ARTHROSCOPY     WISDOM TOOTH EXTRACTION     Family History  Problem Relation Age of Onset   Prostate cancer Neg Hx    Bladder Cancer Neg Hx    Kidney cancer Neg Hx    Social History   Socioeconomic History   Marital status: Married    Spouse name: Darren Tran   Number of children: 2   Years of education: 12   Highest education level: 12th grade  Occupational History   Not on file  Tobacco Use   Smoking status: Never   Smokeless tobacco: Never  Vaping Use  Vaping Use: Never used  Substance and Sexual Activity   Alcohol use: No    Comment: very occasional   Drug use: No   Sexual activity: Not Currently    Birth control/protection: None  Other Topics Concern   Not on file  Social History Narrative   Not on file   Social Determinants of Health   Financial Resource Strain: Low Risk    Difficulty of Paying Living Expenses: Not hard at all  Food Insecurity: No Food Insecurity   Worried About Charity fundraiser in the Last Year: Never true   Ran Out of Food in the Last Year: Never true  Transportation Needs: No Transportation Needs   Lack of Transportation (Medical): No   Lack of Transportation (Non-Medical): No  Physical Activity: Sufficiently Active   Days  of Exercise per Week: 5 days   Minutes of Exercise per Session: 150+ min  Stress: No Stress Concern Present   Feeling of Stress : Not at all  Social Connections: Moderately Integrated   Frequency of Communication with Friends and Family: Twice a week   Frequency of Social Gatherings with Friends and Family: Never   Attends Religious Services: 1 to 4 times per year   Active Member of [employer] or Organizations: Yes   Attends Archivist Meetings: 1 to 4 times per year   Marital Status: Married    Tobacco Counseling Counseling given: Not Answered   Clinical Intake:  Pre-visit preparation completed: Yes  Pain : 0-10 Pain Score: 6  Pain Type: Acute pain Pain Location: Back Pain Orientation: Lower Pain Radiating Towards: Radiates to left leg and middle of the back Pain Descriptors / Indicators: Radiating, Sharp, Constant, Shooting, Throbbing Pain Onset: More than a month ago Pain Frequency: Constant Pain Relieving Factors: [facility]  Pain Relieving Factors: [facility]  Diabetes: No  How often do you need to have someone help you when you read instructions, pamphlets, or other written materials from your doctor or pharmacy?: 2 - Rarely What is the last grade level you completed in school?: 12  Diabetic? No  Interpreter Needed?: No  Information entered by :: Anson [place] CMA   Activities of Daily Living In your present state of health, do you have any difficulty performing the following activities: 04/19/2021  Hearing? Y  Comment Left side  Vision? N  Difficulty concentrating or making decisions? N  Walking or climbing stairs? N  Dressing or bathing? N  Doing errands, shopping? N  Preparing Food and eating ? N  Using the Toilet? N  In the past six months, have you accidently leaked urine? Y  Do you have problems with loss of bowel control? N  Managing your Medications? N  Managing your Finances? N   Housekeeping or managing your Housekeeping? N  Some recent data might be hidden    Patient Care Team: Cletis Athens, MD as PCP - General (Internal Medicine)  Indicate any recent Medical Services you may have received from other than [facility] providers in the past year (date may be approximate).     Assessment:   This is a routine wellness examination for Darren Tran.  Hearing/Vision screen No results found.  Dietary issues and exercise activities discussed: Exercise limited by: None identified   Goals Addressed   None    Depression Screen PHQ 2/9 Scores 04/19/2021 03/04/2021  PHQ - 2 Score 0 0    Fall Risk Fall  Risk  03/04/2021 04/04/2016  Falls in the past year? 0 No  Comment - Emmi Telephone Survey: data to providers prior to load  Number falls in past yr: 0 -  Injury with Fall? 0 -  Risk for fall due to : No Fall Risks -  Follow up Falls evaluation completed -    FALL RISK PREVENTION PERTAINING TO THE HOME:  Any stairs in or around the home? Yes  If so, are there any without handrails? Yes  Home free of loose throw rugs in walkways, pet beds, electrical cords, etc? Yes  Adequate lighting in your home to reduce risk of falls? Yes   ASSISTIVE DEVICES UTILIZED TO PREVENT FALLS:  Life alert? No  Use of a cane, walker or w/c? No  Grab bars in the bathroom? Yes  Shower chair or bench in shower? Yes  Elevated toilet seat or a handicapped toilet? Yes   TIMED UP AND GO:  Was the test performed? No .  Length of time to ambulate 10 feet: 0 sec.     Cognitive Function:        Immunizations Immunization History  Administered Date(s) Administered   Fluad Quad(high Dose 65+) 06/06/2020   Influenza-Unspecified 05/11/2018, 05/16/2019   PFIZER(Purple Top)SARS-COV-2 Vaccination 08/21/2019, 09/11/2019, 06/18/2020, 02/24/2021   Pneumococcal Conjugate-13 04/02/2016   Pneumococcal Polysaccharide-23 05/08/2018   Tdap 03/17/2021   Zoster Recombinat (Shingrix) 05/11/2018,  09/06/2018    TDAP status: Up to date  Flu Vaccine status: Up to date  Pneumococcal vaccine status: Up to date  Covid-19 vaccine status: Completed vaccines  Qualifies for Shingles Vaccine? Yes   Zostavax completed Yes   Shingrix Completed?: Yes  Screening Tests Health Maintenance  Topic Date Due   Hepatitis C Screening  Never done   INFLUENZA VACCINE  03/11/2021   COVID-19 Vaccine (5 - Booster for [place] series) 06/27/2021   COLONOSCOPY (Pts 45-31yr Insurance coverage will need to be confirmed)  01/24/2024   TETANUS/TDAP  03/18/2031   PNA vac Low Risk Adult  Completed   Zoster Vaccines- Shingrix  Completed   HPV VACCINES  Aged Out    Health Maintenance  Health Maintenance Due  Topic Date Due   Hepatitis C Screening  Never done   INFLUENZA VACCINE  03/11/2021    Colorectal cancer screening: Type of screening: Colonoscopy. Completed 01/23/2014. Repeat every 10 years  Lung Cancer Screening: (Low Dose CT Chest recommended if Age 75-80years, 30 pack-year currently smoking OR have quit w/in 15years.) does not qualify.   Lung Cancer Screening Referral: No  Additional Screening:  Hepatitis C Screening: does qualify; Completed No  Vision Screening: Recommended annual ophthalmology exams for early detection of glaucoma and other disorders of the eye. Is the patient up to date with their annual eye exam?  Yes  Who is the provider or what is the name of the office in which the patient attends annual eye exams? Patient does not remember the name of the office. If pt is not established with a provider, would they like to be referred to a provider to establish care? No .   Dental Screening: Recommended annual dental exams for proper oral hygiene  Community Resource Referral / Chronic Care Management: CRR required this visit?  No   CCM required this visit?  No      Plan:     I have personally reviewed and noted the following in the patient's chart:   Medical and  social history Use of alcohol, tobacco or illicit  drugs  Current medications and supplements including opioid prescriptions. Patient is not currently taking opioid prescriptions. Functional ability and status Nutritional status Physical activity Advanced directives List of other physicians Hospitalizations, surgeries, and ER visits in previous 12 months Vitals Screenings to include cognitive, depression, and falls Referrals and appointments  In addition, I have reviewed and discussed with patient certain preventive protocols, quality metrics, and best practice recommendations. A written personalized care plan for preventive services as well as general preventive health recommendations were provided to patient.    Darren Tran , Thank you for taking time to come for your Medicare Wellness Visit. I appreciate your ongoing commitment to your health goals. Please review the following plan we discussed and let me know if I can assist you in the future.   These are the goals we discussed:  Goals   None     This is a list of the screening recommended for you and due dates:  Health Maintenance  Topic Date Due   Hepatitis C Screening: USPSTF Recommendation to screen - Ages 65-79 yo.  Never done   Flu Shot  03/11/2021   COVID-19 Vaccine (5 - Booster for Pfizer series) 06/27/2021   Colon Cancer Screening  01/24/2024   Tetanus Vaccine  03/18/2031   Pneumonia vaccines  Completed   Zoster (Shingles) Vaccine  Completed   HPV Vaccine  Aged [address] [place], [place]   04/19/2021   Nurse Notes:

## 2021-04-20 NOTE — Progress Notes (Signed)
I have reviewed this visit and agree with the documentation.   

## 2021-04-25 NOTE — Addendum Note (Signed)
Addended by: Anson [place] R on: 04/25/2021 11:34 AM   Modules accepted: Orders

## 2021-05-09 ENCOUNTER — Other Ambulatory Visit: Payer: Self-pay | Admitting: Internal Medicine

## 2021-06-10 DIAGNOSIS — M5136 Other intervertebral disc degeneration, lumbar region: Secondary | ICD-10-CM | POA: Insufficient documentation

## 2021-08-30 DIAGNOSIS — M48061 Spinal stenosis, lumbar region without neurogenic claudication: Secondary | ICD-10-CM | POA: Insufficient documentation

## 2021-09-12 ENCOUNTER — Other Ambulatory Visit: Payer: Self-pay | Admitting: Internal Medicine

## 2021-09-12 DIAGNOSIS — I2510 Atherosclerotic heart disease of native coronary artery without angina pectoris: Secondary | ICD-10-CM

## 2021-09-20 ENCOUNTER — Encounter: Payer: Self-pay | Admitting: Urology

## 2021-09-20 ENCOUNTER — Other Ambulatory Visit: Payer: Self-pay

## 2021-09-20 ENCOUNTER — Ambulatory Visit: Payer: Medicare Other | Admitting: Urology

## 2021-09-20 VITALS — BP 117/68 | HR 74 | Ht 72.0 in | Wt 235.0 lb

## 2021-09-20 DIAGNOSIS — Z8546 Personal history of malignant neoplasm of prostate: Secondary | ICD-10-CM | POA: Diagnosis not present

## 2021-09-20 DIAGNOSIS — N3281 Overactive bladder: Secondary | ICD-10-CM | POA: Diagnosis not present

## 2021-09-20 LAB — BLADDER SCAN AMB NON-IMAGING: Scan Result: 45

## 2021-09-20 MED ORDER — MIRABEGRON ER 50 MG PO TB24: 50.0000 mg | ORAL_TABLET | Freq: Every day | ORAL | 0 refills | Status: DC

## 2021-09-20 NOTE — Progress Notes (Signed)
09/20/2021 9:51 AM   Darren Alu Sr. [birth year] [id number]  Referring provider: Cletis Athens, MD [address] [place],  [place] [address]  Chief Complaint  Patient presents with   Follow-up    Urologic History:   1. Prostate Cancer - Radical prostatectomy at [facility] in 08/2003 - PSA remains undetectable; Patient elected to continue PSA surveillance              2. Urinary Frequency/Urgency - Toviaz 4 mg   3. ED - Previously on Edex - Not sexually active at this time   HPI: 76 y.o. male presents for annual follow-up.  Doing fairly well since his last visit Notes occasional straining to urinate and hesitancy Has occasional urgency with urge incontinence Remains on Toviaz   PMH: Past Medical History:  Diagnosis Date   Arthritis    Atrial fibrillation (HCC)    GERD (gastroesophageal reflux disease)    History of prostate cancer    Hyperlipidemia 09/09/2018   Personal history of prostate cancer 08/26/2017   Sciatica    bilateral, intermittent   Sleep apnea    CPAP    Surgical History: Past Surgical History:  Procedure Laterality Date   COLONOSCOPY WITH PROPOFOL N/A 01/24/2019   Procedure: COLONOSCOPY WITH BIOPSY;  Surgeon: Lucilla Lame, MD;  Location: [facility];  Service: Endoscopy;  Laterality: N/A;  sleep apnea   KNEE ARTHROSCOPY     NOSE SURGERY     POLYPECTOMY N/A 01/24/2019   Procedure: POLYPECTOMY;  Surgeon: Lucilla Lame, MD;  Location: [facility];  Service: Endoscopy;  Laterality: N/A;   PROSTATECTOMY     SHOULDER ARTHROSCOPY     WISDOM TOOTH EXTRACTION      Home Medications:  Allergies as of 09/20/2021   No Known Allergies      Medication List        Accurate as of September 20, 2021  9:51 AM. If you have any questions, ask your nurse or doctor.          STOP taking these medications    citalopram 20 MG tablet Commonly known as: CELEXA Stopped by: Abbie Sons, MD       TAKE these medications    apixaban  5 MG Tabs tablet Commonly known as: ELIQUIS Take 5 mg by mouth 2 (two) times daily. Patient takes 1 tablet 1 tablet in the morning and 1 tablet at bedtime   atenolol 50 MG tablet Commonly known as: TENORMIN TAKE 1 TABLET DAILY   Azelastine-Fluticasone 137-50 MCG/ACT Susp Place 1 spray into both nostrils daily as needed.   Cholecalciferol 25 MCG (1000 UT) tablet Take by mouth. What changed: Another medication with the same name was removed. Continue taking this medication, and follow the directions you see here. Changed by: Abbie Sons, MD   DULoxetine 20 MG capsule Commonly known as: CYMBALTA 2 capsules (40 mg total). What changed: Another medication with the same name was removed. Continue taking this medication, and follow the directions you see here. Changed by: Abbie Sons, MD   fenofibrate 160 MG tablet TAKE ONE TABLET BY MOUTH EVERY OTHER DAY HYPERLIPIDEMIA What changed: Another medication with the same name was removed. Continue taking this medication, and follow the directions you see here. Changed by: Abbie Sons, MD   fesoterodine 4 MG Tb24 tablet Commonly known as: TOVIAZ TAKE 1 TABLET DAILY   IRON-C PO Take 65 mg by mouth daily at 2 PM.   levothyroxine 175 MCG tablet Commonly  known as: SYNTHROID 200 mcg.   melatonin 5 MG Tabs Take by mouth at bedtime as needed.   multivitamin capsule Take by mouth.   prazosin 1 MG capsule Commonly known as: MINIPRESS TAKE 3 CAPSULES AT BEDTIME What changed: See the new instructions.   rosuvastatin 10 MG tablet Commonly known as: CRESTOR TAKE 1 TABLET DAILY   tretinoin 0.05 % cream Commonly known as: RETIN-A   vitamin B-12 500 MCG tablet Commonly known as: CYANOCOBALAMIN 4 tablets (2,000 mcg total).        Allergies: No Known Allergies  Family History: Family History  Problem Relation Age of Onset   Prostate cancer Neg Hx    Bladder Cancer Neg Hx    Kidney cancer Neg Hx     Social  History:  reports that he has never smoked. He has never used smokeless tobacco. He reports that he does not drink alcohol and does not use drugs.   Physical Exam: BP 117/68    Pulse 74    Ht 6' (1.829 m)    Wt 235 lb (106.6 kg)    BMI 31.87 kg/m   Constitutional:  Alert and oriented, No acute distress. HEENT: [place] AT, moist mucus membranes.  Trachea midline, no masses. Cardiovascular: No clubbing, cyanosis, or edema. Respiratory: Normal respiratory effort, no increased work of breathing. Psychiatric: Normal mood and affect.   Assessment & Plan:    1.  History of prostate cancer Desires to continue PSA checks and will be notified with results  2.  Overactive bladder PVR by bladder scan 45 mL Was interested in a beta 3 agonist trial and given samples of Myrbetriq 50 mg.  Will call back regarding efficacy Continue annual follow-up   Abbie Sons, MD  [facility] [address], [place], [place] [address] [phone]

## 2021-09-21 LAB — PSA: Prostate Specific Ag, Serum: <0.1 ng/mL (ref 0.0–4.0)

## 2021-09-22 ENCOUNTER — Encounter: Payer: Self-pay | Admitting: Urology

## 2021-11-07 ENCOUNTER — Encounter: Payer: Self-pay | Admitting: Urology

## 2021-11-07 ENCOUNTER — Ambulatory Visit: Payer: Medicare Other | Admitting: Urology

## 2021-11-07 VITALS — BP 122/74 | HR 82 | Ht 72.0 in | Wt 235.0 lb

## 2021-11-07 DIAGNOSIS — N3281 Overactive bladder: Secondary | ICD-10-CM | POA: Diagnosis not present

## 2021-11-07 DIAGNOSIS — Z8546 Personal history of malignant neoplasm of prostate: Secondary | ICD-10-CM

## 2021-11-08 ENCOUNTER — Encounter: Payer: Self-pay | Admitting: Urology

## 2021-11-08 NOTE — Progress Notes (Signed)
? ?11/07/2021 ?7:42 AM  ? ?Darren DORNER Sr. ?Nov 03, 1945 ?[id number] ? ?Referring provider: Cletis Athens, MD ?[facility] ?[place],  [place] [address] ? ?Chief Complaint  ?Patient presents with  ? Follow-up  ? ? ?Urologic History: ?  ?1. Prostate Cancer ?- Radical prostatectomy at [facility] in 08/2003 ?- PSA remains undetectable; Patient elected to continue PSA surveillance ?             ?2. Urinary Frequency/Urgency ?- Toviaz 4 mg ?  ?3. ED ?- Previously on Edex ?- Not sexually active at this time  ? ?HPI: ?76 y.o. male presents to discuss testosterone replacement therapy ? 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 ?PMH: ?Past Medical History:  ?Diagnosis Date  ? Arthritis   ? Atrial fibrillation ([facility])   ? GERD (gastroesophageal reflux disease)   ? History of prostate cancer   ? Hyperlipidemia 09/09/2018  ? Personal history of prostate cancer 08/26/2017  ? Sciatica   ? bilateral, intermittent  ? Sleep apnea   ? CPAP  ? ? ?Surgical History: ?Past Surgical History:  ?Procedure Laterality Date  ? COLONOSCOPY WITH PROPOFOL N/A 01/24/2019  ? Procedure: COLONOSCOPY WITH BIOPSY;  Surgeon: Lucilla Lame, MD;  Location: [facility];  Service: Endoscopy;  Laterality: N/A;  sleep apnea  ? KNEE ARTHROSCOPY    ? NOSE SURGERY    ? POLYPECTOMY N/A 01/24/2019  ? Procedure: POLYPECTOMY;  Surgeon: Lucilla Lame, MD;  Location: [facility];  Service: Endoscopy;  Laterality: N/A;  ? PROSTATECTOMY    ? SHOULDER ARTHROSCOPY    ? WISDOM TOOTH EXTRACTION    ? ? ?Home Medications:  ?Allergies as of 11/07/2021   ?No Known Allergies ?  ? ?  ?Medication List  ?  ? ?  ? Accurate as of November 07, 2021  11:59 PM. If you have any questions, ask your nurse or doctor.  ?  ?  ? ?  ? ?apixaban 5 MG Tabs tablet ?Commonly known as: ELIQUIS ?Take 5 mg by mouth 2 (two) times daily. Patient takes 1 tablet 1 tablet in the morning and 1 tablet at bedtime ?  ?atenolol 50 MG tablet ?Commonly known as: TENORMIN ?TAKE 1 TABLET DAILY ?  ?azelastine 0.1 % nasal spray ?Commonly known as: ASTELIN ?USE 1 SPRAY IN EACH NOSTRIL  PRN ?  ?Azelastine-Fluticasone 137-50 MCG/ACT Susp ?Place 1 spray into both nostrils daily as needed. ?  ?Cholecalciferol 25 MCG (1000 UT) tablet ?Take by mouth. ?  ?citalopram 40 MG tablet ?Commonly known as: CELEXA ?TAKE 1.5 TABLETS BY MOUTH ONCE EVERY DAY ?  ?DSS 100 MG Caps ?TAKE 2 CAPSULES BY MOUTH AS DIRECTED BY YOUR PROVIDER ?  ?DULoxetine 60 MG capsule ?Commonly known as: CYMBALTA ?TAKE ONE CAPSULE BY MOUTH IN THE MORNING FOR MOOD ?What changed: Another medication with the same name was removed. Continue taking this medication, and follow the directions you see here. ?Changed by: Abbie Sons, MD ?  ?ezetimibe 10 MG tablet ?Commonly known as: ZETIA ?Take 1 tablet by mouth daily. ?  ?fenofibrate 160 MG tablet ?TAKE ONE TABLET BY MOUTH EVERY OTHER DAY HYPERLIPIDEMIA ?  ?fesoterodine 4 MG Tb24 tablet ?Commonly known  as: TOVIAZ ?TAKE 1 TABLET DAILY ?  ?IRON-C PO ?Take 65 mg by mouth daily at 2 PM. ?  ?levothyroxine 200 MCG tablet ?Commonly known as: SYNTHROID ?TAKE ONE TABLET BY MOUTH EVERY MORNING HYPOTHYROIDISM ?What changed: Another medication with the same name was removed. Continue taking this medication, and follow the directions you see here. ?Changed by: Abbie Sons, MD ?  ?melatonin 5 MG Tabs ?Take by mouth at bedtime as needed. ?  ?mirabegron ER 50 MG Tb24 tablet ?Commonly known as: MYRBETRIQ ?Take 1 tablet (50 mg total) by mouth daily. ?  ?mometasone 50 MCG/ACT nasal spray ?Commonly known as: NASONEX ?USE 1 SPRAY IN EACH NOSTRIL  PRN ?  ?multivitamin capsule ?Take by mouth. ?  ?prazosin 1 MG  capsule ?Commonly known as: MINIPRESS ?TAKE 3 CAPSULES AT BEDTIME ?What changed: See the new instructions. ?  ?rosuvastatin 10 MG tablet ?Commonly known as: CRESTOR ?TAKE 1 TABLET DAILY ?  ?traZODone 50 MG tablet ?Commonly known as: DESYREL ?Take 1-2 tablets by mouth at bedtime. ?  ?tretinoin 0.05 % cream ?Commonly known as: RETIN-A ?  ?vitamin B-12 500 MCG tablet ?Commonly known as: CYANOCOBALAMIN ?4 tablets (2,000 mcg total). ?  ? ?  ? ? ?Allergies: No Known Allergies ? ?Family History: ?Family History  ?Problem Relation Age of Onset  ? Prostate cancer Neg Hx   ? Bladder Cancer Neg Hx   ? Kidney cancer Neg Hx   ? ? ?Social History:  reports that he has never smoked. He has never used smokeless tobacco. He reports that he does not drink alcohol and does not use drugs. ? ? ?Physical Exam: ?BP 122/74   Pulse 82   Ht 6' (1.829 m)   Wt 235 lb (106.6 kg)   BMI 31.87 kg/m?   ?Constitutional:  Alert and oriented, No acute distress. ?Psychiatric: Normal mood and affect. ? ? ?Assessment & Plan:   ?We discussed TRT is only indicated if he has to low levels drawn in the a.m. ?He desires further evaluation and lab visit scheduled ? ? ? ? ?Abbie Sons, MD ? ?[facility] ?[address] ?[place], [place] [address] ?[phone] ? ?

## 2021-11-11 ENCOUNTER — Other Ambulatory Visit: Payer: Medicare Other

## 2021-11-11 ENCOUNTER — Other Ambulatory Visit: Payer: Self-pay

## 2021-11-11 DIAGNOSIS — Z8546 Personal history of malignant neoplasm of prostate: Secondary | ICD-10-CM

## 2021-11-12 ENCOUNTER — Encounter: Payer: Self-pay | Admitting: Urology

## 2021-11-12 LAB — TESTOSTERONE: Testosterone: 415 ng/dL (ref 264–916)

## 2021-12-03 DIAGNOSIS — Z9689 Presence of other specified functional implants: Secondary | ICD-10-CM | POA: Insufficient documentation

## 2022-01-13 ENCOUNTER — Other Ambulatory Visit: Payer: Self-pay

## 2022-02-21 ENCOUNTER — Ambulatory Visit: Payer: Medicare Other | Admitting: Nurse Practitioner

## 2022-02-21 ENCOUNTER — Encounter: Payer: Self-pay | Admitting: Nurse Practitioner

## 2022-02-21 VITALS — BP 93/57 | HR 63 | Ht 72.0 in | Wt 228.2 lb

## 2022-02-21 DIAGNOSIS — M25512 Pain in left shoulder: Secondary | ICD-10-CM

## 2022-02-21 NOTE — Progress Notes (Unsigned)
Established Patient Office Visit  Subjective:  Patient ID: Darren LIBERATORE Sr., male    DOB: 1946-05-06  Age: 76 y.o. MRN: [id number]  CC:  Chief Complaint  Patient presents with   Shoulder Pain    Patient having left shoulder pain x 4 days.    HPI: Darren BUCKLIN Sr. presents for left shoulder pain after a fall 4 days ago.  Shoulder Pain  The pain is present in the left shoulder. This is a new problem. The current episode started in the past 7 days. The problem occurs intermittently. The problem has been unchanged. The quality of the pain is described as aching. The pain is at a severity of 5/10. Associated symptoms include a limited range of motion. Pertinent negatives include no fever, itching, joint swelling or stiffness. He has tried nothing for the symptoms.     Past Medical History:  Diagnosis Date   Arthritis    Atrial fibrillation (HCC)    GERD (gastroesophageal reflux disease)    History of prostate cancer    Hyperlipidemia 09/09/2018   Personal history of prostate cancer 08/26/2017   Sciatica    bilateral, intermittent   Sleep apnea    CPAP    Past Surgical History:  Procedure Laterality Date   COLONOSCOPY WITH PROPOFOL N/A 01/24/2019   Procedure: COLONOSCOPY WITH BIOPSY;  Surgeon: Lucilla Lame, MD;  Location: [facility];  Service: Endoscopy;  Laterality: N/A;  sleep apnea   KNEE ARTHROSCOPY     NOSE SURGERY     POLYPECTOMY N/A 01/24/2019   Procedure: POLYPECTOMY;  Surgeon: Lucilla Lame, MD;  Location: [facility];  Service: Endoscopy;  Laterality: N/A;   PROSTATECTOMY     SHOULDER ARTHROSCOPY     WISDOM TOOTH EXTRACTION      Family History  Problem Relation Age of Onset   Prostate cancer Neg Hx    Bladder Cancer Neg Hx    Kidney cancer Neg Hx     Social History   Socioeconomic History   Marital status: Married    Spouse name: Scientist, water quality   Number of children: 2   Years of education: 12   Highest education level: 12th grade   Occupational History   Not on file  Tobacco Use   Smoking status: Never   Smokeless tobacco: Never  Vaping Use   Vaping Use: Never used  Substance and Sexual Activity   Alcohol use: No    Comment: very occasional   Drug use: No   Sexual activity: Not Currently    Birth control/protection: None  Other Topics Concern   Not on file  Social History Narrative   Not on file   Social Determinants of Health   Financial Resource Strain: Low Risk  (04/19/2021)   Overall Financial Resource Strain (CARDIA)    Difficulty of Paying Living Expenses: Not hard at all  Food Insecurity: No Food Insecurity (04/19/2021)   Hunger Vital Sign    Worried About Running Out of Food in the Last Year: Never true    [place] in the Last Year: Never true  Transportation Needs: No Transportation Needs (04/19/2021)   PRAPARE - Hydrologist (Medical): No    Lack of Transportation (Non-Medical): No  Physical Activity: Sufficiently Active (04/19/2021)   Exercise Vital Sign    Days of Exercise per Week: 5 days    Minutes of Exercise per Session: 150+ min  Stress: Stress Concern Present (04/25/2021)  [place] Questionnaire    Feeling of Stress : Very much  Social Connections: Socially Integrated (04/25/2021)   Social Connection and Isolation Panel [NHANES]    Frequency of Communication with Friends and Family: More than three times a week    Frequency of Social Gatherings with Friends and Family: Once a week    Attends Religious Services: More than 4 times per year    Active Member of [employer] or Organizations: Yes    Attends Music therapist: More than 4 times per year    Marital Status: Married  Human resources officer Violence: Not At Risk (04/19/2021)   Humiliation, Afraid, Rape, and Kick questionnaire    Fear of Current or Ex-Partner: No    Emotionally Abused: No    Physically Abused: No    Sexually Abused: No      Current Outpatient Medications:    apixaban (ELIQUIS) 5 MG TABS tablet, Take 5 mg by mouth 2 (two) times daily. Patient takes 1 tablet 1 tablet in the morning and 1 tablet at bedtime, Disp: , Rfl:    atenolol (TENORMIN) 50 MG tablet, TAKE 1 TABLET DAILY, Disp: 90 tablet, Rfl: 3   azelastine (ASTELIN) 0.1 % nasal spray, USE 1 SPRAY IN EACH NOSTRIL  PRN, Disp: , Rfl:    Azelastine-Fluticasone 137-50 MCG/ACT SUSP, Place 1 spray into both nostrils daily as needed., Disp: , Rfl:    Cholecalciferol 25 MCG (1000 UT) tablet, Take by mouth., Disp: , Rfl:    citalopram (CELEXA) 40 MG tablet, TAKE 1.5 TABLETS BY MOUTH ONCE EVERY DAY, Disp: , Rfl:    Docusate Sodium (DSS) 100 MG CAPS, TAKE 2 CAPSULES BY MOUTH AS DIRECTED BY YOUR PROVIDER, Disp: , Rfl:    DULoxetine (CYMBALTA) 60 MG capsule, TAKE ONE CAPSULE BY MOUTH IN THE MORNING FOR MOOD, Disp: , Rfl:    ezetimibe (ZETIA) 10 MG tablet, Take 1 tablet by mouth daily., Disp: , Rfl:    fenofibrate 160 MG tablet, TAKE ONE TABLET BY MOUTH EVERY OTHER DAY HYPERLIPIDEMIA, Disp: , Rfl:    Ferrous Gluconate-C-Folic Acid (IRON-C PO), Take 65 mg by mouth daily at 2 PM., Disp: , Rfl:    fesoterodine (TOVIAZ) 4 MG TB24 tablet, TAKE 1 TABLET DAILY, Disp: 90 tablet, Rfl: 3   levothyroxine (SYNTHROID) 200 MCG tablet, TAKE ONE TABLET BY MOUTH EVERY MORNING HYPOTHYROIDISM, Disp: , Rfl:    Melatonin 5 MG TABS, Take by mouth at bedtime as needed., Disp: , Rfl:    mirabegron ER (MYRBETRIQ) 50 MG TB24 tablet, Take 1 tablet (50 mg total) by mouth daily., Disp: 30 tablet, Rfl: 0   mometasone (NASONEX) 50 MCG/ACT nasal spray, USE 1 SPRAY IN EACH NOSTRIL  PRN, Disp: , Rfl:    Multiple Vitamin (MULTIVITAMIN) capsule, Take by mouth., Disp: , Rfl:    prazosin (MINIPRESS) 1 MG capsule, TAKE 3 CAPSULES AT BEDTIME (Patient taking differently: Take 3 mg by mouth at bedtime.), Disp: 270 capsule, Rfl: 3   rosuvastatin (CRESTOR) 10 MG tablet, TAKE 1 TABLET DAILY, Disp: 90 tablet, Rfl:  3   traZODone (DESYREL) 50 MG tablet, Take 1-2 tablets by mouth at bedtime., Disp: , Rfl:    tretinoin (RETIN-A) 0.05 % cream, , Disp: , Rfl:    vitamin B-12 (CYANOCOBALAMIN) 500 MCG tablet, 4 tablets (2,000 mcg total)., Disp: , Rfl:    No Known Allergies  ROS Review of Systems  Constitutional:  Negative for diaphoresis and fever.  HENT:  Negative for hearing loss, mouth sores and sneezing.   Eyes: Negative.   Respiratory:  Negative for chest tightness.   Cardiovascular:  Negative for chest pain, palpitations and leg swelling.  Gastrointestinal:  Negative for abdominal distention and anal bleeding.  Endocrine: Negative.   Genitourinary:  Negative for difficulty urinating and flank pain.  Musculoskeletal:  Positive for arthralgias. Negative for stiffness.  Skin: Negative.  Negative for itching.  Allergic/Immunologic: Negative.   Neurological:  Negative for dizziness, facial asymmetry and light-headedness.  Psychiatric/Behavioral:  Negative for agitation and behavioral problems.   All other systems reviewed and are negative.     Objective:    Physical Exam Vitals reviewed.  Constitutional:      Appearance: Normal appearance.  HENT:     Head: Normocephalic.     Right Ear: Tympanic membrane normal.     Left Ear: Tympanic membrane normal.     Mouth/Throat:     Mouth: Mucous membranes are moist.  Eyes:     Pupils: Pupils are equal, round, and reactive to light.  Neck:     Vascular: No carotid bruit.  Cardiovascular:     Rate and Rhythm: Normal rate and regular rhythm.     Pulses: Normal pulses.     Heart sounds: Normal heart sounds.  Pulmonary:     Effort: Pulmonary effort is normal.     Breath sounds: Normal breath sounds.  Abdominal:     General: Bowel sounds are normal.     Palpations: Abdomen is soft. There is no hepatomegaly or splenomegaly.     Tenderness: There is no abdominal tenderness.     Hernia: No hernia is present.  Musculoskeletal:     Left shoulder:  Crepitus present. Decreased range of motion.     Cervical back: Neck supple.     Right lower leg: No edema.     Left lower leg: No edema.  Skin:    Findings: No rash.  Neurological:     Mental Status: He is alert and oriented to person, place, and time.     Motor: No weakness.  Psychiatric:        Mood and Affect: Mood normal.        Behavior: Behavior normal.     BP (!) 93/57   Pulse 63   Ht 6' (1.829 m)   Wt 228 lb 3.2 oz (103.5 kg)   BMI 30.95 kg/m  Wt Readings from Last 3 Encounters:  02/21/22 228 lb 3.2 oz (103.5 kg)  11/07/21 235 lb (106.6 kg)  09/20/21 235 lb (106.6 kg)     Health Maintenance Due  Topic Date Due   Hepatitis C Screening  Never done   COVID-19 Vaccine (5 - Booster for [place] series) 04/21/2021    There are no preventive care reminders to display for this patient.  Lab Results  Component Value Date   TSH 3.10 06/19/2020   Lab Results  Component Value Date   WBC 4.9 09/26/2020   HGB 11.9 (L) 09/26/2020   HCT 36.1 (L) 09/26/2020   MCV 94.3 09/26/2020   PLT 228 09/26/2020   Lab Results  Component Value Date   NA 140 06/19/2020   K 4.4 06/19/2020   CO2 26 06/19/2020   GLUCOSE 79 06/19/2020   BUN 23 06/19/2020   CREATININE 1.09 06/19/2020   BILITOT 0.7 06/19/2020   ALKPHOS 64 04/29/2018   AST 15 06/19/2020   ALT 17 06/19/2020   PROT 6.5 06/19/2020   ALBUMIN 4.7 04/29/2018  CALCIUM 9.7 06/19/2020   ANIONGAP 6 03/11/2018   Lab Results  Component Value Date   CHOL 159 06/19/2020   Lab Results  Component Value Date   HDL 54 06/19/2020   Lab Results  Component Value Date   LDLCALC 88 06/19/2020   Lab Results  Component Value Date   TRIG 78 06/19/2020   Lab Results  Component Value Date   CHOLHDL 2.9 06/19/2020   Lab Results  Component Value Date   HGBA1C 5.7 (H) 03/11/2018      Assessment & Plan:   Problem List Items Addressed This Visit       Other   Acute pain of left shoulder - Primary    Advised patient  to apply cold pack OTC NSAIDs. Advised patient to visit [facility] urgent care.      No orders of the defined types were placed in this encounter.   Follow-up: Return if symptoms worsen or fail to improve.    Theresia Lo, NP

## 2022-02-26 ENCOUNTER — Encounter: Payer: Self-pay | Admitting: Nurse Practitioner

## 2022-02-26 DIAGNOSIS — M25512 Pain in left shoulder: Secondary | ICD-10-CM | POA: Insufficient documentation

## 2022-02-26 NOTE — Assessment & Plan Note (Signed)
Advised patient to apply cold pack OTC NSAIDs. Advised patient to visit [facility] urgent care.

## 2022-03-13 ENCOUNTER — Other Ambulatory Visit: Payer: Self-pay

## 2022-05-16 ENCOUNTER — Ambulatory Visit (INDEPENDENT_AMBULATORY_CARE_PROVIDER_SITE_OTHER): Payer: Medicare Other | Admitting: *Deleted

## 2022-05-16 DIAGNOSIS — Z23 Encounter for immunization: Secondary | ICD-10-CM | POA: Diagnosis not present

## 2022-06-17 ENCOUNTER — Other Ambulatory Visit: Payer: Self-pay | Admitting: Internal Medicine

## 2022-06-30 ENCOUNTER — Encounter: Payer: Medicare Other | Admitting: Internal Medicine

## 2022-07-17 ENCOUNTER — Ambulatory Visit (INDEPENDENT_AMBULATORY_CARE_PROVIDER_SITE_OTHER): Payer: Medicare Other

## 2022-07-17 DIAGNOSIS — D093 Carcinoma in situ of thyroid and other endocrine glands: Secondary | ICD-10-CM | POA: Insufficient documentation

## 2022-07-17 DIAGNOSIS — F32A Depression, unspecified: Secondary | ICD-10-CM | POA: Insufficient documentation

## 2022-07-17 DIAGNOSIS — R7303 Prediabetes: Secondary | ICD-10-CM | POA: Insufficient documentation

## 2022-07-17 DIAGNOSIS — F329 Major depressive disorder, single episode, unspecified: Secondary | ICD-10-CM | POA: Insufficient documentation

## 2022-07-17 DIAGNOSIS — F419 Anxiety disorder, unspecified: Secondary | ICD-10-CM | POA: Insufficient documentation

## 2022-07-17 DIAGNOSIS — F321 Major depressive disorder, single episode, moderate: Secondary | ICD-10-CM | POA: Insufficient documentation

## 2022-07-17 DIAGNOSIS — Z0000 Encounter for general adult medical examination without abnormal findings: Secondary | ICD-10-CM | POA: Diagnosis not present

## 2022-07-17 DIAGNOSIS — F4312 Post-traumatic stress disorder, chronic: Secondary | ICD-10-CM | POA: Insufficient documentation

## 2022-07-17 DIAGNOSIS — M545 Low back pain, unspecified: Secondary | ICD-10-CM | POA: Insufficient documentation

## 2022-07-17 DIAGNOSIS — K317 Polyp of stomach and duodenum: Secondary | ICD-10-CM | POA: Insufficient documentation

## 2022-07-17 DIAGNOSIS — F4310 Post-traumatic stress disorder, unspecified: Secondary | ICD-10-CM | POA: Insufficient documentation

## 2022-07-17 DIAGNOSIS — G4733 Obstructive sleep apnea (adult) (pediatric): Secondary | ICD-10-CM | POA: Insufficient documentation

## 2022-07-17 DIAGNOSIS — E890 Postprocedural hypothyroidism: Secondary | ICD-10-CM | POA: Insufficient documentation

## 2022-07-17 DIAGNOSIS — H903 Sensorineural hearing loss, bilateral: Secondary | ICD-10-CM | POA: Insufficient documentation

## 2022-07-17 HISTORY — DX: Major depressive disorder, single episode, moderate: F32.1

## 2022-07-17 HISTORY — DX: Carcinoma in situ of thyroid and other endocrine glands: D09.3

## 2022-07-17 NOTE — Patient Instructions (Signed)
Darren Tran , Thank you for taking time to come for your Medicare Wellness Visit. I appreciate your ongoing commitment to your health goals. Please review the following plan we discussed and let me know if I can assist you in the future.   Screening recommendations/referrals: Colonoscopy: 01/24/19 Recommended yearly ophthalmology/optometry visit for glaucoma screening and checkup Recommended yearly dental visit for hygiene and checkup  Vaccinations: Influenza vaccine: 05/16/22 Pneumococcal vaccine: 05/08/18 Tdap vaccine: 03/17/21 Shingles vaccine: Shingrix 05/11/18, 09/06/18   Covid-19: 08/21/19, 09/11/19, 06/18/20, 02/24/21  Advanced directives: yes  Conditions/risks identified: none  Next appointment: Follow up in one year for your annual wellness visit.  Preventive Care 7 Years and Older, Male Preventive care refers to lifestyle choices and visits with your health care provider that can promote health and wellness. What does preventive care include? A yearly physical exam. This is also called an annual well check. Dental exams once or twice a year. Routine eye exams. Ask your health care provider how often you should have your eyes checked. Personal lifestyle choices, including: Daily care of your teeth and gums. Regular physical activity. Eating a healthy diet. Avoiding tobacco and drug use. Limiting alcohol use. Practicing safe sex. Taking low doses of aspirin every day. Taking vitamin and mineral supplements as recommended by your health care provider. What happens during an annual well check? The services and screenings done by your health care provider during your annual well check will depend on your age, overall health, lifestyle risk factors, and family history of disease. Counseling  Your health care provider may ask you questions about your: Alcohol use. Tobacco use. Drug use. Emotional well-being. Home and relationship well-being. Sexual activity. Eating  habits. History of falls. Memory and ability to understand (cognition). Work and work Statistician. Screening  You may have the following tests or measurements: Height, weight, and BMI. Blood pressure. Lipid and cholesterol levels. These may be checked every 5 years, or more frequently if you are over 45 years old. Skin check. Lung cancer screening. You may have this screening every year starting at age 80 if you have a 30-pack-year history of smoking and currently smoke or have quit within the past 15 years. Fecal occult blood test (FOBT) of the stool. You may have this test every year starting at age 41. Flexible sigmoidoscopy or colonoscopy. You may have a sigmoidoscopy every 5 years or a colonoscopy every 10 years starting at age 23. Prostate cancer screening. Recommendations will vary depending on your family history and other risks. Hepatitis C blood test. Hepatitis B blood test. Sexually transmitted disease (STD) testing. Diabetes screening. This is done by checking your blood sugar (glucose) after you have not eaten for a while (fasting). You may have this done every 1-3 years. Abdominal aortic aneurysm (AAA) screening. You may need this if you are a current or former smoker. Osteoporosis. You may be screened starting at age 13 if you are at high risk. Talk with your health care provider about your test results, treatment options, and if necessary, the need for more tests. Vaccines  Your health care provider may recommend certain vaccines, such as: Influenza vaccine. This is recommended every year. Tetanus, diphtheria, and acellular pertussis (Tdap, Td) vaccine. You may need a Td booster every 10 years. Zoster vaccine. You may need this after age 59. Pneumococcal 13-valent conjugate (PCV13) vaccine. One dose is recommended after age 22. Pneumococcal polysaccharide (PPSV23) vaccine. One dose is recommended after age 2. Talk to your health care provider about which  screenings and  vaccines you need and how often you need them. This information is not intended to replace advice given to you by your health care provider. Make sure you discuss any questions you have with your health care provider. Document Released: 08/24/2015 Document Revised: 04/16/2016 Document Reviewed: 05/29/2015 Elsevier Interactive Patient Education  2017 [place] Prevention in the Home Falls can cause injuries. They can happen to people of all ages. There are many things you can do to make your home safe and to help prevent falls. What can I do on the outside of my home? Regularly fix the edges of walkways and driveways and fix any cracks. Remove anything that might make you trip as you walk through a door, such as a raised step or threshold. Trim any bushes or trees on the path to your home. Use bright outdoor lighting. Clear any walking paths of anything that might make someone trip, such as rocks or tools. Regularly check to see if handrails are loose or broken. Make sure that both sides of any steps have handrails. Any raised decks and porches should have guardrails on the edges. Have any leaves, snow, or ice cleared regularly. Use sand or salt on walking paths during winter. Clean up any spills in your garage right away. This includes oil or grease spills. What can I do in the bathroom? Use night lights. Install grab bars by the toilet and in the tub and shower. Do not use towel bars as grab bars. Use non-skid mats or decals in the tub or shower. If you need to sit down in the shower, use a plastic, non-slip stool. Keep the floor dry. Clean up any water that spills on the floor as soon as it happens. Remove soap buildup in the tub or shower regularly. Attach bath mats securely with double-sided non-slip rug tape. Do not have throw rugs and other things on the floor that can make you trip. What can I do in the bedroom? Use night lights. Make sure that you have a light by your  bed that is easy to reach. Do not use any sheets or blankets that are too big for your bed. They should not hang down onto the floor. Have a firm chair that has side arms. You can use this for support while you get dressed. Do not have throw rugs and other things on the floor that can make you trip. What can I do in the kitchen? Clean up any spills right away. Avoid walking on wet floors. Keep items that you use a lot in easy-to-reach places. If you need to reach something above you, use a strong step stool that has a grab bar. Keep electrical cords out of the way. Do not use floor polish or wax that makes floors slippery. If you must use wax, use non-skid floor wax. Do not have throw rugs and other things on the floor that can make you trip. What can I do with my stairs? Do not leave any items on the stairs. Make sure that there are handrails on both sides of the stairs and use them. Fix handrails that are broken or loose. Make sure that handrails are as long as the stairways. Check any carpeting to make sure that it is firmly attached to the stairs. Fix any carpet that is loose or worn. Avoid having throw rugs at the top or bottom of the stairs. If you do have throw rugs, attach them to the floor with carpet  tape. Make sure that you have a light switch at the top of the stairs and the bottom of the stairs. If you do not have them, ask someone to add them for you. What else can I do to help prevent falls? Wear shoes that: Do not have high heels. Have rubber bottoms. Are comfortable and fit you well. Are closed at the toe. Do not wear sandals. If you use a stepladder: Make sure that it is fully opened. Do not climb a closed stepladder. Make sure that both sides of the stepladder are locked into place. Ask someone to hold it for you, if possible. Clearly mark and make sure that you can see: Any grab bars or handrails. First and last steps. Where the edge of each step is. Use tools that  help you move around (mobility aids) if they are needed. These include: Canes. Walkers. Scooters. Crutches. Turn on the lights when you go into a dark area. Replace any light bulbs as soon as they burn out. Set up your furniture so you have a clear path. Avoid moving your furniture around. If any of your floors are uneven, fix them. If there are any pets around you, be aware of where they are. Review your medicines with your doctor. Some medicines can make you feel dizzy. This can increase your chance of falling. Ask your doctor what other things that you can do to help prevent falls. This information is not intended to replace advice given to you by your health care provider. Make sure you discuss any questions you have with your health care provider. Document Released: 05/24/2009 Document Revised: 01/03/2016 Document Reviewed: 09/01/2014 Elsevier Interactive Patient Education  2017 [employer].

## 2022-07-17 NOTE — Progress Notes (Signed)
Virtual Visit via Telephone Note  I connected with  Darren LESSER Sr. on 07/17/22 at  9:00 AM EST by telephone and verified that I am speaking with the correct person using two identifiers.  Location: Patient: home Provider: [facility] Persons participating in the virtual visit: patient/Nurse Health Advisor   I discussed the limitations, risks, security and privacy concerns of performing an evaluation and management service by telephone and the availability of in person appointments. The patient expressed understanding and agreed to proceed.  Interactive audio and video telecommunications were attempted between this nurse and patient, however failed, due to patient having technical difficulties OR patient did not have access to video capability.  We continued and completed visit with audio only.  Some vital signs may be absent or patient reported.   Dionisio David, LPN  Subjective:   Darren KROON Sr. is a 76 y.o. male who presents for Medicare Annual/Subsequent preventive examination.  Review of Systems     Cardiac Risk Factors include: advanced age (>77mn, >>110women);dyslipidemia;male gender     Objective:    Today's Vitals   07/17/22 0858  PainSc: 4    There is no height or weight on file to calculate BMI.     07/17/2022    9:08 AM 04/25/2021   10:41 AM 01/24/2019   10:58 AM  Advanced Directives  Does Patient Have a Medical Advance Directive? Yes Yes No  Type of [place]   Does patient want to make changes to medical advance directive? No - Patient declined No - Patient declined   Copy of [place] Chart? Yes - validated most recent copy scanned in chart (See row information) No - copy requested   Would patient like information on creating a medical advance directive?   No - Patient declined    Current Medications (verified) Outpatient Encounter Medications  as of 07/17/2022  Medication Sig   apixaban (ELIQUIS) 5 MG TABS tablet Take 5 mg by mouth 2 (two) times daily. Patient takes 1 tablet 1 tablet in the morning and 1 tablet at bedtime   atenolol (TENORMIN) 50 MG tablet TAKE 1 TABLET DAILY   azelastine (ASTELIN) 0.1 % nasal spray USE 1 SPRAY IN EACH NOSTRIL  PRN   Cholecalciferol 25 MCG (1000 UT) tablet Take by mouth.   citalopram (CELEXA) 40 MG tablet TAKE 1.5 TABLETS BY MOUTH ONCE EVERY DAY   Docusate Sodium (DSS) 100 MG CAPS TAKE 2 CAPSULES BY MOUTH AS DIRECTED BY YOUR PROVIDER   DULoxetine (CYMBALTA) 60 MG capsule TAKE ONE CAPSULE BY MOUTH IN THE MORNING FOR MOOD   ezetimibe (ZETIA) 10 MG tablet Take 1 tablet by mouth daily.   fenofibrate 160 MG tablet TAKE ONE TABLET BY MOUTH EVERY OTHER DAY HYPERLIPIDEMIA   fesoterodine (TOVIAZ) 4 MG TB24 tablet TAKE 1 TABLET DAILY   levothyroxine (SYNTHROID) 200 MCG tablet TAKE ONE TABLET BY MOUTH EVERY MORNING HYPOTHYROIDISM   Melatonin 5 MG TABS Take by mouth at bedtime as needed.   mirabegron ER (MYRBETRIQ) 50 MG TB24 tablet Take 1 tablet (50 mg total) by mouth daily.   mometasone (NASONEX) 50 MCG/ACT nasal spray USE 1 SPRAY IN EACH NOSTRIL  PRN   Multiple Vitamin (MULTIVITAMIN) capsule Take by mouth.   prazosin (MINIPRESS) 1 MG capsule TAKE 3 CAPSULES AT BEDTIME (Patient taking differently: Take 3 mg by mouth at bedtime.)   rosuvastatin (CRESTOR) 10 MG tablet TAKE 1  TABLET DAILY   traZODone (DESYREL) 50 MG tablet Take 1-2 tablets by mouth at bedtime.   tretinoin (RETIN-A) 0.05 % cream    vitamin B-12 (CYANOCOBALAMIN) 500 MCG tablet 4 tablets (2,000 mcg total).   Ferrous Gluconate-C-Folic Acid (IRON-C PO) Take 65 mg by mouth daily at 2 PM. (Patient not taking: Reported on 07/17/2022)   [DISCONTINUED] Azelastine-Fluticasone 137-50 MCG/ACT SUSP Place 1 spray into both nostrils daily as needed.   No facility-administered encounter medications on file as of 07/17/2022.    Allergies (verified) Patient has  no known allergies.   History: Past Medical History:  Diagnosis Date   Arthritis    Atrial fibrillation (HCC)    GERD (gastroesophageal reflux disease)    History of prostate cancer    Hyperlipidemia 09/09/2018   Personal history of prostate cancer 08/26/2017   Sciatica    bilateral, intermittent   Sleep apnea    CPAP   Past Surgical History:  Procedure Laterality Date   COLONOSCOPY WITH PROPOFOL N/A 01/24/2019   Procedure: COLONOSCOPY WITH BIOPSY;  Surgeon: Lucilla Lame, MD;  Location: [facility];  Service: Endoscopy;  Laterality: N/A;  sleep apnea   KNEE ARTHROSCOPY     NOSE SURGERY     POLYPECTOMY N/A 01/24/2019   Procedure: POLYPECTOMY;  Surgeon: Lucilla Lame, MD;  Location: [place];  Service: Endoscopy;  Laterality: N/A;   PROSTATECTOMY     SHOULDER ARTHROSCOPY     WISDOM TOOTH EXTRACTION     Family History  Problem Relation Age of Onset   Prostate cancer Neg Hx    Bladder Cancer Neg Hx    Kidney cancer Neg Hx    Social History   Socioeconomic History   Marital status: Married    Spouse name: Scientist, water quality   Number of children: 2   Years of education: 12   Highest education level: 12th grade  Occupational History   Not on file  Tobacco Use   Smoking status: Never   Smokeless tobacco: Never  Vaping Use   Vaping Use: Never used  Substance and Sexual Activity   Alcohol use: No    Comment: very occasional   Drug use: No   Sexual activity: Not Currently    Birth control/protection: None  Other Topics Concern   Not on file  Social History Narrative   Not on file   Social Determinants of Health   Financial Resource Strain: Low Risk  (07/17/2022)   Overall Financial Resource Strain (CARDIA)    Difficulty of Paying Living Expenses: Not hard at all  Food Insecurity: No Food Insecurity (07/17/2022)   Hunger Vital Sign    Worried About Running Out of Food in the Last Year: Never true    [place] in the Last Year: Never true   Transportation Needs: No Transportation Needs (07/17/2022)   PRAPARE - Hydrologist (Medical): No    Lack of Transportation (Non-Medical): No  Physical Activity: Insufficiently Active (07/17/2022)   Exercise Vital Sign    Days of Exercise per Week: 2 days    Minutes of Exercise per Session: 60 min  Stress: Stress Concern Present (07/17/2022)   [place]    Feeling of Stress : To some extent  Social Connections: Socially Isolated (07/17/2022)   Social Connection and Isolation Panel [NHANES]    Frequency of Communication with Friends and Family: Twice a week    Frequency of Social Gatherings  with Friends and Family: Never    Attends Religious Services: Never    Marine scientist or Organizations: No    Attends Music therapist: Never    Marital Status: Married    Tobacco Counseling Counseling given: Not Answered   Clinical Intake:  Pre-visit preparation completed: Yes  Pain : 0-10 Pain Score: 4  Pain Type: Chronic pain Pain Location: Back Pain Orientation: Lower Pain Radiating Towards: left or right, has herniated discs Pain Onset: More than a month ago Pain Frequency: Constant Pain Relieving Factors: tylenol, had stimulator put in few months ago  Pain Relieving Factors: tylenol, had stimulator put in few months ago  Nutritional Risks: None Diabetes: No  How often do you need to have someone help you when you read instructions, pamphlets, or other written materials from your doctor or pharmacy?: 1 - Never  Diabetic?no  Interpreter Needed?: No  Information entered by :: Kirke Shaggy, LPN   Activities of Daily Living    07/17/2022    9:09 AM  In your present state of health, do you have any difficulty performing the following activities:  Hearing? 0  Vision? 0  Difficulty concentrating or making decisions? 0  Walking or climbing stairs? 0  Dressing  or bathing? 0  Doing errands, shopping? 0  Preparing Food and eating ? N  Using the Toilet? N  In the past six months, have you accidently leaked urine? N  Do you have problems with loss of bowel control? N  Managing your Medications? N  Managing your Finances? N  Housekeeping or managing your Housekeeping? N    Patient Care Team: Cletis Athens, MD as PCP - General (Internal Medicine)  Indicate any recent Medical Services you may have received from other than [facility] providers in the past year (date may be approximate).     Assessment:   This is a routine wellness examination for [place].  Hearing/Vision screen Hearing Screening - Comments:: No aids Vision Screening - Comments:: Wears glasses- [facility]  Dietary issues and exercise activities discussed: Current Exercise Habits: Home exercise routine, Type of exercise: walking, Time (Minutes): 60, Frequency (Times/Week): 2, Weekly Exercise (Minutes/Week): 120, Intensity: Mild   Goals Addressed             This Visit's Progress    DIET - EAT MORE FRUITS AND VEGETABLES         Depression Screen    07/17/2022    9:05 AM 04/25/2021   11:25 AM 04/19/2021    3:47 PM 03/04/2021   12:08 PM  PHQ 2/9 Scores  PHQ - 2 Score 2 2 0 0  PHQ- 9 Score 4 10      Fall Risk    07/17/2022    9:09 AM 03/04/2021   12:09 PM 04/04/2016   11:50 AM  Fall Risk   Falls in the past year? 0 0 No  Comment   Emmi Telephone Survey: data to providers prior to load  Number falls in past yr: 0 0   Injury with Fall? 0 0   Risk for fall due to : No Fall Risks No Fall Risks   Follow up Falls prevention discussed;Falls evaluation completed Falls evaluation completed     FALL RISK PREVENTION PERTAINING TO THE HOME:  Any stairs in or around the home? Yes  If so, are there any without handrails? No  Home free of loose throw rugs in walkways, pet beds, electrical cords, etc? Yes  Adequate lighting in  your home to reduce risk of falls? Yes    ASSISTIVE DEVICES UTILIZED TO PREVENT FALLS:  Life alert? No  Use of a cane, walker or w/c? No  Grab bars in the bathroom? Yes  Shower chair or bench in shower? Yes  Elevated toilet seat or a handicapped toilet? Yes     Cognitive Function:        07/17/2022    9:16 AM 04/19/2021    4:11 PM  6CIT Screen  What Year? 0 points 0 points  What month? 0 points 0 points  What time? 0 points 0 points  Count back from 20 0 points 0 points  Months in reverse 0 points 0 points  Repeat phrase 0 points 0 points  Total Score 0 points 0 points    Immunizations Immunization History  Administered Date(s) Administered   Fluad Quad(high Dose 65+) 06/06/2020, 05/16/2022   Influenza-Unspecified 05/11/2018, 05/16/2019, 05/21/2021   PFIZER Comirnaty(Gray Top)Covid-19 Tri-Sucrose Vaccine 02/24/2021   PFIZER(Purple Top)SARS-COV-2 Vaccination 08/21/2019, 09/11/2019, 06/18/2020, 02/24/2021   Pneumococcal Conjugate-13 04/02/2016   Pneumococcal Polysaccharide-23 05/08/2018   Td 09/21/2021   Tdap 03/17/2021, 03/21/2022   Zoster Recombinat (Shingrix) 05/11/2018, 09/06/2018   Zoster, Live 07/20/2013    TDAP status: Up to date  Flu Vaccine status: Up to date  Pneumococcal vaccine status: Up to date  Covid-19 vaccine status: Completed vaccines  Qualifies for Shingles Vaccine? Yes   Zostavax completed No   Shingrix Completed?: Yes  Screening Tests Health Maintenance  Topic Date Due   Hepatitis C Screening  Never done   COVID-19 Vaccine (5 - 2023-24 season) 04/11/2022   Medicare Annual Wellness (AWV)  07/18/2023   COLONOSCOPY (Pts 45-59yr Insurance coverage will need to be confirmed)  01/24/2024   DTaP/Tdap/Td (4 - Td or Tdap) 03/21/2032   Pneumonia Vaccine 76 Years old  Completed   INFLUENZA VACCINE  Completed   Zoster Vaccines- Shingrix  Completed   HPV VACCINES  Aged Out    Health Maintenance  Health Maintenance Due  Topic Date Due   Hepatitis C Screening  Never done    COVID-19 Vaccine (5 - 2023-24 season) 04/11/2022    Colorectal cancer screening: Type of screening: Colonoscopy. Completed 01/24/19. Repeat every 4 years  Lung Cancer Screening: (Low Dose CT Chest recommended if Age 76-80years, 30 pack-year currently smoking OR have quit w/in 15years.) does not qualify.   Additional Screening:  Hepatitis C Screening: does qualify; Completed no  Vision Screening: Recommended annual ophthalmology exams for early detection of glaucoma and other disorders of the eye. Is the patient up to date with their annual eye exam?  Yes  Who is the provider or what is the name of the office in which the patient attends annual eye exams? [facility] If pt is not established with a provider, would they like to be referred to a provider to establish care? No .   Dental Screening: Recommended annual dental exams for proper oral hygiene  Community Resource Referral / Chronic Care Management: CRR required this visit?  No   CCM required this visit?  No      Plan:     I have personally reviewed and noted the following in the patient's chart:   Medical and social history Use of alcohol, tobacco or illicit drugs  Current medications and supplements including opioid prescriptions. Patient is not currently taking opioid prescriptions. Functional ability and status Nutritional status Physical activity Advanced directives List of other physicians Hospitalizations, surgeries, and ER visits in  previous 12 months Vitals Screenings to include cognitive, depression, and falls Referrals and appointments  In addition, I have reviewed and discussed with patient certain preventive protocols, quality metrics, and best practice recommendations. A written personalized care plan for preventive services as well as general preventive health recommendations were provided to patient.     Dionisio David, LPN   [id number]   Nurse Notes: none

## 2022-07-24 NOTE — Progress Notes (Signed)
I have reviewed this visit and agree with the documentation.   

## 2022-09-24 ENCOUNTER — Ambulatory Visit: Payer: Medicare Other | Admitting: Urology

## 2022-09-24 ENCOUNTER — Encounter: Payer: Self-pay | Admitting: Urology

## 2022-09-24 VITALS — BP 115/67 | HR 90 | Ht 72.0 in | Wt 230.0 lb

## 2022-09-24 DIAGNOSIS — N3281 Overactive bladder: Secondary | ICD-10-CM

## 2022-09-24 DIAGNOSIS — Z8546 Personal history of malignant neoplasm of prostate: Secondary | ICD-10-CM | POA: Diagnosis not present

## 2022-09-24 MED ORDER — FESOTERODINE FUMARATE ER 4 MG PO TB24: 4.0000 mg | ORAL_TABLET | Freq: Every day | ORAL | 3 refills | Status: DC

## 2022-09-24 NOTE — Progress Notes (Signed)
09/24/2022 9:19 AM   Kathrene Alu Sr. [birth year] [id number]  Referring provider: Cletis Athens, MD [address],  [place] [address]  No chief complaint on file.   Urologic History:   1. Prostate Cancer - Radical prostatectomy at [facility] in 08/2003 - PSA remains undetectable; Patient elected to continue PSA surveillance              2. Urinary Frequency/Urgency - Toviaz 4 mg   3. ED - Previously on Edex - Not sexually active at this time   HPI: 77 y.o. male presents for annual follow-up.  Doing fairly well since his last visit Notes occasional straining to urinate and hesitancy Has occasional urgency with urge incontinence Remains on Toviaz   PMH: Past Medical History:  Diagnosis Date   Arthritis    Atrial fibrillation (HCC)    GERD (gastroesophageal reflux disease)    History of prostate cancer    Hyperlipidemia 09/09/2018   Personal history of prostate cancer 08/26/2017   Sciatica    bilateral, intermittent   Sleep apnea    CPAP    Surgical History: Past Surgical History:  Procedure Laterality Date   COLONOSCOPY WITH PROPOFOL N/A 01/24/2019   Procedure: COLONOSCOPY WITH BIOPSY;  Surgeon: Lucilla Lame, MD;  Location: [facility];  Service: Endoscopy;  Laterality: N/A;  sleep apnea   KNEE ARTHROSCOPY     NOSE SURGERY     POLYPECTOMY N/A 01/24/2019   Procedure: POLYPECTOMY;  Surgeon: Lucilla Lame, MD;  Location: [facility];  Service: Endoscopy;  Laterality: N/A;   PROSTATECTOMY     SHOULDER ARTHROSCOPY     WISDOM TOOTH EXTRACTION      Home Medications:  Allergies as of 09/24/2022   No Known Allergies      Medication List        Accurate as of September 24, 2022  9:19 AM. If you have any questions, ask your nurse or doctor.          apixaban 5 MG Tabs tablet Commonly known as: ELIQUIS Take 5 mg by mouth 2 (two) times daily. Patient takes 1 tablet 1 tablet in the morning and 1 tablet at bedtime   atenolol 50 MG  tablet Commonly known as: TENORMIN TAKE 1 TABLET DAILY   azelastine 0.1 % nasal spray Commonly known as: ASTELIN USE 1 SPRAY IN EACH NOSTRIL  PRN   Cholecalciferol 25 MCG (1000 UT) tablet Take by mouth.   citalopram 40 MG tablet Commonly known as: CELEXA TAKE 1.5 TABLETS BY MOUTH ONCE EVERY DAY   cyanocobalamin 500 MCG tablet Commonly known as: VITAMIN B12 4 tablets (2,000 mcg total).   DSS 100 MG Caps TAKE 2 CAPSULES BY MOUTH AS DIRECTED BY YOUR PROVIDER   DULoxetine 60 MG capsule Commonly known as: CYMBALTA TAKE ONE CAPSULE BY MOUTH IN THE MORNING FOR MOOD   ezetimibe 10 MG tablet Commonly known as: ZETIA Take 1 tablet by mouth daily.   fenofibrate 160 MG tablet TAKE ONE TABLET BY MOUTH EVERY OTHER DAY HYPERLIPIDEMIA   fesoterodine 4 MG Tb24 tablet Commonly known as: TOVIAZ TAKE 1 TABLET DAILY   IRON-C PO Take 65 mg by mouth daily at 2 PM.   levothyroxine 200 MCG tablet Commonly known as: SYNTHROID TAKE ONE TABLET BY MOUTH EVERY MORNING HYPOTHYROIDISM   melatonin 5 MG Tabs Take by mouth at bedtime as needed.   mirabegron ER 50 MG Tb24 tablet Commonly known as: MYRBETRIQ Take 1 tablet (50 mg total) by  mouth daily.   mometasone 50 MCG/ACT nasal spray Commonly known as: NASONEX USE 1 SPRAY IN EACH NOSTRIL  PRN   multivitamin capsule Take by mouth.   prazosin 1 MG capsule Commonly known as: MINIPRESS TAKE 3 CAPSULES AT BEDTIME What changed: See the new instructions.   rosuvastatin 10 MG tablet Commonly known as: CRESTOR TAKE 1 TABLET DAILY   traZODone 50 MG tablet Commonly known as: DESYREL Take 1-2 tablets by mouth at bedtime.   tretinoin 0.05 % cream Commonly known as: RETIN-A        Allergies: No Known Allergies  Family History: Family History  Problem Relation Age of Onset   Prostate cancer Neg Hx    Bladder Cancer Neg Hx    Kidney cancer Neg Hx     Social History:  reports that he has never smoked. He has never used smokeless  tobacco. He reports that he does not drink alcohol and does not use drugs.   Physical Exam: BP 115/67   Pulse 90   Ht 6' (1.829 m)   Wt 230 lb (104.3 kg)   BMI 31.19 kg/m   Constitutional:  Alert and oriented, No acute distress. HEENT: [place] AT, moist mucus membranes.  Trachea midline, no masses. Cardiovascular: No clubbing, cyanosis, or edema. Respiratory: Normal respiratory effort, no increased work of breathing. Psychiatric: Normal mood and affect.   Assessment & Plan:    1.  History of prostate cancer Desires to continue PSA checks and will be notified with results  2.  Overactive bladder PVR by bladder scan 45 mL Was interested in a beta 3 agonist trial and given samples of Myrbetriq 50 mg.  Will call back regarding efficacy Continue annual follow-up   Abbie Sons, MD  [facility] [address] [place], [place] [address] [phone]

## 2022-09-25 ENCOUNTER — Telehealth: Payer: Self-pay | Admitting: *Deleted

## 2022-09-25 ENCOUNTER — Encounter: Payer: Self-pay | Admitting: Urology

## 2022-09-25 LAB — PSA: Prostate Specific Ag, Serum: <0.1 ng/mL (ref 0.0–4.0)

## 2022-09-25 NOTE — Telephone Encounter (Signed)
Notified patient as instructed,.  

## 2022-09-25 NOTE — Telephone Encounter (Signed)
-----   Message from Abbie Sons, MD sent at 09/25/2022  7:14 AM EST ----- PSA remains undetectable at <0.1

## 2022-09-25 NOTE — Telephone Encounter (Signed)
Left message on voice mail per DPR .  °

## 2022-10-03 ENCOUNTER — Ambulatory Visit
Admission: EM | Admit: 2022-10-03 | Discharge: 2022-10-03 | Disposition: A | Discharge status: Home / Self Care | Payer: Medicare Other

## 2022-10-03 ENCOUNTER — Ambulatory Visit (INDEPENDENT_AMBULATORY_CARE_PROVIDER_SITE_OTHER): Payer: Medicare Other

## 2022-10-03 DIAGNOSIS — J0100 Acute maxillary sinusitis, unspecified: Secondary | ICD-10-CM | POA: Diagnosis not present

## 2022-10-03 DIAGNOSIS — R051 Acute cough: Secondary | ICD-10-CM

## 2022-10-03 DIAGNOSIS — R059 Cough, unspecified: Secondary | ICD-10-CM | POA: Insufficient documentation

## 2022-10-03 DIAGNOSIS — J209 Acute bronchitis, unspecified: Secondary | ICD-10-CM | POA: Diagnosis not present

## 2022-10-03 MED ORDER — AMOXICILLIN-POT CLAVULANATE 875-125 MG PO TABS: 1.0000 | ORAL_TABLET | Freq: Two times a day (BID) | ORAL | 0 refills | Status: DC
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
## 2022-10-03 NOTE — Discharge Instructions (Addendum)
Take the Augmentin as directed.  Follow up with your primary care provider on Monday.

## 2022-10-19 ENCOUNTER — Ambulatory Visit (INDEPENDENT_AMBULATORY_CARE_PROVIDER_SITE_OTHER): Payer: Medicare Other

## 2022-10-19 ENCOUNTER — Ambulatory Visit
Admission: RE | Admit: 2022-10-19 | Discharge: 2022-10-19 | Disposition: A | Discharge status: Home / Self Care | Payer: Medicare Other | Source: Ambulatory Visit | Attending: Emergency Medicine | Admitting: Emergency Medicine

## 2022-10-19 VITALS — BP 106/68 | HR 96 | Temp 100.2°F | Resp 18 | Ht 72.0 in | Wt 230.0 lb

## 2022-10-19 DIAGNOSIS — J189 Pneumonia, unspecified organism: Secondary | ICD-10-CM

## 2022-10-19 DIAGNOSIS — R058 Other specified cough: Secondary | ICD-10-CM

## 2022-10-19 MED ORDER — CEFDINIR 300 MG PO CAPS: 300.0000 mg | ORAL_CAPSULE | Freq: Two times a day (BID) | ORAL | 0 refills | Status: AC

## 2022-10-19 MED ORDER — AZITHROMYCIN 250 MG PO TABS: 250.0000 mg | ORAL_TABLET | Freq: Every day | ORAL | 0 refills | Status: DC

## 2022-10-19 NOTE — ED Triage Notes (Signed)
Patient to Urgent Care with complaints of worsening nasal congestion, headaches, sinus pressure. Recently treated for bronchitis. Reports cough has stayed the same, worsened this week.   Cough is productive w/ discolored mucus. Possible fevers at home. Using chloroseptic spray/ tylenol.

## 2022-10-19 NOTE — Discharge Instructions (Addendum)
Take the cefdinir and Zithromax as directed.    Follow up with your primary care provider tomorrow.

## 2022-10-19 NOTE — ED Provider Notes (Signed)
Darren Tran    CSN: [id number] Arrival date & time: 10/19/22  1111      History   Chief Complaint Chief Complaint  Patient presents with   Nasal Congestion    HPI Darren Tran Sr. is a 77 y.o. male.  Patient presents with ongoing and worsening productive cough, sinus congestion, sinus pressure.  He reports chills last night but has not been able to take his temperature at home.  No OTC medications taken today.  He denies ear pain, sore throat, chest pain, shortness of breath, or other symptoms.  Patient was seen here on 09/30/2022; diagnosed with cough, acute bronchitis, acute sinusitis; treated with Augmentin.  His medical history includes hypertension, atrial fibrillation, sleep apnea, thyroid cancer, prostate cancer.   The history is provided by the patient, medical records and the spouse.    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   Major depressive disorder, single episode, unspecified 07/17/2022   Obstructive sleep apnea (adult) (pediatric) 07/17/2022   Polyp of stomach and duodenum 07/17/2022   Anxiety disorder, unspecified 07/17/2022   Anxiety 07/17/2022   Anxiety disorder, unspecified 07/17/2022   Post-traumatic stress disorder, chronic 07/17/2022   Post-traumatic stress disorder, unspecified 07/17/2022   Posttraumatic stress disorder 07/17/2022   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   Prediabetes 07/17/2022   Sensorineural  hearing loss, 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   Acute pain of left shoulder 02/26/2022   S/P insertion of spinal cord stimulator 12/03/2021   Degenerative lumbar spinal stenosis 08/30/2021   DDD (degenerative disc disease), lumbar 06/10/2021   Paroxysmal atrial fibrillation ([place]) 03/04/2021   Spondylosis without myelopathy or radiculopathy, lumbar region 02/08/2021   Anemia 12/06/2020   Thyroid cancer ([place]) 09/26/2020   Iron deficiency anemia 09/26/2020   Gastrointestinal hemorrhage 08/29/2020   Multiple gastric polyps 08/21/2020   OSA on CPAP 08/21/2020   S/P prostatectomy 08/21/2020   Annual physical exam 06/18/2020   Coronary artery disease, non-occlusive 06/18/2020   Dupuytren's contracture of hand 06/18/2020   Lumbar facet arthropathy 06/12/2020   Seasonal allergic rhinitis due to pollen 12/30/2019   Sacroiliitis ([place]) 09/01/2019   Family history of malignant neoplasm of gastrointestinal tract    Polyp of sigmoid colon    Hyperlipidemia 09/09/2018   Essential hypertension 09/09/2018   Overactive bladder 09/09/2018   Personal history of prostate cancer 08/26/2017   Urinary frequency 08/26/2017   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 Authorizing Provider  azithromycin (ZITHROMAX) 250 MG tablet Take 1 tablet (250 mg total) by mouth daily. Take first 2 tablets  together, then 1 every day until finished. 10/19/22  Yes Sharion Balloon, NP  cefdinir (OMNICEF) 300 MG capsule Take 1 capsule (300 mg total) by mouth 2 (two) times daily for 7 days. 10/19/22 10/26/22 Yes Sharion Balloon, NP  apixaban (ELIQUIS) 5 MG TABS tablet Take 5 mg by mouth 2 (two) times daily. Patient takes 1 tablet 1 tablet in the morning and 1 tablet at bedtime    [provider]  atenolol (TENORMIN) 50 MG tablet TAKE 1 TABLET DAILY 02/15/21   Cletis Athens, MD  atenolol (TENORMIN) 50 MG tablet TAKE ONE TABLET BY MOUTH DAILY FOR HEART 07/02/09   [provider]  azelastine (ASTELIN) 0.1 % nasal spray USE 1 SPRAY IN EACH NOSTRIL  PRN 04/30/21   [provider]  Cholecalciferol 25 MCG (1000 UT) tablet Take by mouth. 04/30/21   [provider]  citalopram (CELEXA) 40 MG tablet TAKE 1.5 TABLETS BY MOUTH ONCE EVERY DAY 11/07/09   [provider]  citalopram (CELEXA) 40 MG tablet TAKE 1.5 TABLETS BY MOUTH ONCE EVERY DAY 11/07/09   [provider]  cyanocobalamin (VITAMIN B12) 500 MCG tablet Take 4 tablets by mouth daily. 04/30/21   [provider]  Docusate Sodium (DSS) 100 MG CAPS TAKE 2 CAPSULES BY MOUTH AS DIRECTED BY YOUR PROVIDER 04/30/21   [provider]  DULoxetine (CYMBALTA) 60 MG capsule TAKE ONE CAPSULE BY MOUTH IN THE MORNING FOR MOOD 06/20/21   [provider]  ezetimibe (ZETIA) 10 MG tablet Take 1 tablet by mouth daily. 07/02/09   [provider]  fenofibrate 160 MG tablet TAKE ONE TABLET BY MOUTH EVERY OTHER DAY HYPERLIPIDEMIA 09/13/21   [provider]  fenofibrate 160 MG tablet TAKE ONE TABLET BY MOUTH EVERY OTHER DAY HYPERLIPIDEMIA 03/21/22   [provider]  Ferrous Gluconate-C-Folic Acid (IRON-C PO) Take 65 mg by mouth daily at 2 PM.    [provider]  fesoterodine (TOVIAZ) 4 MG TB24 tablet Take 1 tablet (4 mg total) by mouth daily. 09/24/22   Stoioff, Ronda Fairly, MD  ipratropium  (ATROVENT) 0.06 % nasal spray SPRAY 2 SPRAYS INTO EACH NOSTRIL THREE TIMES A DAY AS NEEDED FOR NASAL CONGESTION 09/08/22   [provider]  levothyroxine (SYNTHROID) 200 MCG tablet TAKE ONE TABLET BY MOUTH EVERY MORNING HYPOTHYROIDISM 08/29/21   [provider]  Melatonin 5 MG TABS Take by mouth at bedtime as needed.    [provider]  mirabegron ER (MYRBETRIQ) 50 MG TB24 tablet Take 1 tablet by mouth daily. 09/20/21   [provider]  mometasone (NASONEX) 50 MCG/ACT nasal spray USE 1 SPRAY IN EACH NOSTRIL  PRN 04/30/21   [provider]  Multiple Vitamin (MULTIVITAMIN) capsule Take by mouth.    [provider]  prazosin (MINIPRESS) 1 MG capsule TAKE 3 CAPSULES AT BEDTIME Patient taking differently: Take 3 mg by mouth at bedtime. 11/06/20   Cletis Athens, MD  rosuvastatin (CRESTOR) 10 MG tablet TAKE 1 TABLET DAILY 09/15/21   Cletis Athens, MD  traZODone (DESYREL) 50 MG tablet Take 1-2 tablets by mouth at bedtime. 09/07/09   [provider]  tretinoin (RETIN-A)  0.05 % cream  09/03/18   [provider]  vitamin B-12 (CYANOCOBALAMIN) 500 MCG tablet 4 tablets (2,000 mcg total). 04/30/21   [provider]    Family History Family History  Problem Relation Age of Onset   Prostate cancer Neg Hx    Bladder Cancer Neg Hx    Kidney cancer Neg Hx     Social History Social History   Tobacco Use   Smoking status: Never   Smokeless tobacco: Never  Vaping Use   Vaping Use: Never used  Substance Use Topics   Alcohol use: No    Comment: very occasional   Drug use: No     Allergies   Patient has no known allergies.   Review of Systems Review of Systems  Constitutional:  Negative for chills and fever.  HENT:  Positive for congestion, postnasal drip, rhinorrhea and sinus pressure. Negative for ear pain and sore throat.   Respiratory:  Positive for cough. Negative for shortness of breath.   Cardiovascular:  Negative for  chest pain and palpitations.  Gastrointestinal:  Negative for abdominal pain, diarrhea and vomiting.  Skin:  Negative for color change and rash.  All other systems reviewed and are negative.    Physical Exam Triage Vital Signs ED Triage Vitals [10/19/22 1124]  Enc Vitals Group     BP      Pulse Rate 96     Resp 18     Temp 100.2 F (37.9 C)     Temp src      SpO2 93 %     Weight      Height      Head Circumference      Peak Flow      Pain Score      Pain Loc      Pain Edu?      Excl. in [place]?    No data found.  Updated Vital Signs BP 106/68   Pulse 96   Temp 100.2 F (37.9 C)   Resp 18   Ht 6' (1.829 m)   Wt 230 lb (104.3 kg)   SpO2 93%   BMI 31.19 kg/m   Visual Acuity Right Eye Distance:   Left Eye Distance:   Bilateral Distance:    Right Eye Near:   Left Eye Near:    Bilateral Near:     Physical Exam Vitals and nursing note reviewed.  Constitutional:      General: He is not in acute distress.    Appearance: Normal appearance. He is well-developed. He is not ill-appearing.  HENT:     Right Ear: Tympanic membrane normal.     Left Ear: Tympanic membrane normal.     Nose: Congestion and rhinorrhea present.     Mouth/Throat:     Mouth: Mucous membranes are moist.     Pharynx: Oropharynx is clear.  Cardiovascular:     Rate and Rhythm: Normal rate and regular rhythm.     Heart sounds: Normal heart sounds.  Pulmonary:     Effort: Pulmonary effort is normal. No respiratory distress.     Breath sounds: Normal breath sounds.  Musculoskeletal:     Cervical back: Neck supple.  Skin:    General: Skin is warm and dry.  Neurological:     Mental Status: He is alert.  Psychiatric:        Mood and Affect: Mood normal.        Behavior: Behavior normal.      UC  Treatments / Results  Labs (all labs ordered are listed, but only abnormal results are displayed) Labs Reviewed - No data to display  EKG   Radiology DG Chest 2 View  Result Date:  10/19/2022 CLINICAL DATA:  Worsening cough, congestion, headache, and sinus pressure status post recent treatment for bronchitis EXAM: CHEST - 2 VIEW COMPARISON:  Chest radiograph dated 10/03/2022 FINDINGS: Normal lung volumes. Increased prominence of bibasilar linear opacities, right-greater-than-left. No pleural effusion or pneumothorax. The heart size and mediastinal contours are within normal limits. The visualized skeletal structures are unremarkable. Intrathecal stimulator leads project over the level of midthoracic spine. IMPRESSION: Increased prominence of bibasilar linear opacities, right-greater-than-left, which may represent atelectasis or infection. Electronically Signed   By: Darrin Nipper M.D.   On: 10/19/2022 12:07    Procedures Procedures (including critical care time)  Medications Ordered in UC Medications - No data to display  Initial Impression / Assessment and Plan / UC Course  I have reviewed the triage vital signs and the nursing notes.  Pertinent labs & imaging results that were available during my care of the patient were reviewed by me and considered in my medical decision making (see chart for details).   Productive cough, bilateral pneumonia.  No acute respiratory distress.  Temp 100.2, O2 sat 93% on room air.  Chest x-ray shows increased opacities.  Treating today with cefdinir and Zithromax.  Education provided on pneumonia.  Instructed patient to follow-up with his PCP tomorrow.  He has a PCP at the [place]; his local PCPs office is temporarily closed.  ED precautions discussed.  Patient agrees to plan of care.   Final Clinical Impressions(s) / UC Diagnoses   Final diagnoses:  Productive cough  Pneumonia of both lower lobes due to infectious organism     Discharge Instructions      Take the cefdinir and Zithromax as directed.    Follow up with your primary care provider tomorrow.       ED Prescriptions     Medication Sig Dispense Auth. Provider   cefdinir  (OMNICEF) 300 MG capsule Take 1 capsule (300 mg total) by mouth 2 (two) times daily for 7 days. 14 capsule Sharion Balloon, NP   azithromycin (ZITHROMAX) 250 MG tablet Take 1 tablet (250 mg total) by mouth daily. Take first 2 tablets together, then 1 every day until finished. 6 tablet Sharion Balloon, NP      PDMP not reviewed this encounter.   Sharion Balloon, NP 10/19/22 1228

## 2022-10-31 ENCOUNTER — Emergency Department
Admission: EM | Admit: 2022-10-31 | Discharge: 2022-10-31 | Disposition: A | Discharge status: Home / Self Care | Payer: Medicare Other | Attending: Emergency Medicine | Admitting: Emergency Medicine

## 2022-10-31 ENCOUNTER — Emergency Department: Payer: Medicare Other

## 2022-10-31 ENCOUNTER — Ambulatory Visit
Admission: RE | Admit: 2022-10-31 | Discharge: 2022-10-31 | Disposition: A | Discharge status: Home / Self Care | Payer: Medicare Other | Source: Ambulatory Visit | Attending: Emergency Medicine | Admitting: Emergency Medicine

## 2022-10-31 ENCOUNTER — Ambulatory Visit (INDEPENDENT_AMBULATORY_CARE_PROVIDER_SITE_OTHER): Payer: Medicare Other

## 2022-10-31 ENCOUNTER — Other Ambulatory Visit: Payer: Self-pay

## 2022-10-31 VITALS — BP 106/76 | HR 78 | Temp 97.9°F | Resp 18 | Ht 72.0 in | Wt 230.0 lb

## 2022-10-31 DIAGNOSIS — R053 Chronic cough: Secondary | ICD-10-CM | POA: Diagnosis not present

## 2022-10-31 DIAGNOSIS — J40 Bronchitis, not specified as acute or chronic: Secondary | ICD-10-CM

## 2022-10-31 DIAGNOSIS — I482 Chronic atrial fibrillation, unspecified: Secondary | ICD-10-CM

## 2022-10-31 DIAGNOSIS — R9389 Abnormal findings on diagnostic imaging of other specified body structures: Secondary | ICD-10-CM

## 2022-10-31 DIAGNOSIS — R0602 Shortness of breath: Secondary | ICD-10-CM | POA: Diagnosis not present

## 2022-10-31 DIAGNOSIS — U071 COVID-19: Secondary | ICD-10-CM | POA: Diagnosis not present

## 2022-10-31 DIAGNOSIS — R059 Cough, unspecified: Secondary | ICD-10-CM | POA: Diagnosis present

## 2022-10-31 LAB — BASIC METABOLIC PANEL
Anion gap: 8 (ref 5–15)
BUN: 21 mg/dL (ref 8–23)
CO2: 26 mmol/L (ref 22–32)
Calcium: 9.6 mg/dL (ref 8.9–10.3)
Chloride: 106 mmol/L (ref 98–111)
Creatinine, Ser: 1.14 mg/dL (ref 0.61–1.24)
GFR, Estimated: >60 mL/min (ref >60)
Glucose, Bld: 92 mg/dL (ref 70–99)
Potassium: 4.7 mmol/L (ref 3.5–5.1)
Sodium: 140 mmol/L (ref 135–145)

## 2022-10-31 LAB — TROPONIN I (HIGH SENSITIVITY): Troponin I (High Sensitivity): 5 ng/L (ref <18)

## 2022-10-31 LAB — RESP PANEL BY RT-PCR (RSV, FLU A&B, COVID)  RVPGX2
Influenza A by PCR: NEGATIVE
Influenza B by PCR: NEGATIVE
Resp Syncytial Virus by PCR: NEGATIVE
SARS Coronavirus 2 by RT PCR: POSITIVE — AB

## 2022-10-31 LAB — CBC
HCT: 44.2 % (ref 39.0–52.0)
Hemoglobin: 14.6 g/dL (ref 13.0–17.0)
MCH: 31.5 pg (ref 26.0–34.0)
MCHC: 33 g/dL (ref 30.0–36.0)
MCV: 95.5 fL (ref 80.0–100.0)
Platelets: 194 10*3/uL (ref 150–400)
RBC: 4.63 MIL/uL (ref 4.22–5.81)
RDW: 12.8 % (ref 11.5–15.5)
WBC: 4.6 10*3/uL (ref 4.0–10.5)
nRBC: 0 % (ref 0.0–0.2)

## 2022-10-31 LAB — BRAIN NATRIURETIC PEPTIDE: B Natriuretic Peptide: 50 pg/mL (ref 0.0–100.0)

## 2022-10-31 MED ORDER — ALBUTEROL SULFATE (2.5 MG/3ML) 0.083% IN NEBU
5.0000 mg | INHALATION_SOLUTION | Freq: Once | RESPIRATORY_TRACT | Status: AC
  Administered 2022-10-31: 5 mg via RESPIRATORY_TRACT
  Filled 2022-10-31: qty 6

## 2022-10-31 MED ORDER — PREDNISONE 20 MG PO TABS: 40.0000 mg | ORAL_TABLET | Freq: Every day | ORAL | 0 refills | Status: AC

## 2022-10-31 MED ORDER — PAXLOVID (300/100) 20 X 150 MG & 10 X 100MG PO TBPK: 3.0000 | ORAL_TABLET | Freq: Two times a day (BID) | ORAL | 0 refills | Status: AC

## 2022-10-31 MED ORDER — PREDNISONE 20 MG PO TABS
40.0000 mg | ORAL_TABLET | ORAL | Status: AC
  Administered 2022-10-31: 40 mg via ORAL
  Filled 2022-10-31: qty 2

## 2022-10-31 MED ORDER — ALBUTEROL SULFATE HFA 108 (90 BASE) MCG/ACT IN AERS: 2.0000 | INHALATION_SPRAY | RESPIRATORY_TRACT | 0 refills | Status: AC | PRN

## 2022-10-31 NOTE — ED Provider Notes (Signed)
[facility] Provider Note    Event Date/Time   First MD Initiated Contact with Patient 10/31/22 1152     (approximate)   History   Chief Complaint: Cough   HPI  Darren KETELSEN Sr. is a 77 y.o. male who was sent to the ED today for evaluation of abnormal chest x-ray.  Over the past few weeks, patient has developed productive cough, shortness of breath, seen in urgent care several times.  He was initially prescribed Augmentin, after that was given cefdinir with azithromycin.  Went back for the third visit today and chest x-ray still showed some infiltrates, radiology report recommended CT scan so he was sent to the ED.  No fever, eating and drinking okay, no dizziness or chest pain.  Main symptom is now nonproductive cough, worse at night, no hemoptysis     Physical Exam   Triage Vital Signs: ED Triage Vitals  Enc Vitals Group     BP 10/31/22 1116 (!) 121/103     Pulse Rate 10/31/22 1116 80     Resp 10/31/22 1120 18     Temp 10/31/22 1116 97.7 F (36.5 C)     Temp Source 10/31/22 1116 Oral     SpO2 10/31/22 1116 97 %     Weight --      Height --      Head Circumference --      Peak Flow --      Pain Score 10/31/22 1119 3     Pain Loc --      Pain Edu? --      Excl. in [place]? --     Most recent vital signs: Vitals:   10/31/22 1116 10/31/22 1120  BP: (!) 121/103   Pulse: 80   Resp:  18  Temp: 97.7 F (36.5 C)   SpO2: 97%     General: Awake, no distress.  CV:  Good peripheral perfusion.  Regular rate and rhythm Resp:  Normal effort.  Clear to auscultation bilaterally.  Inducible wheezing with cough Abd:  No distention.  Other:  No lower extremity edema or calf tenderness   ED Results / Procedures / Treatments   Labs (all labs ordered are listed, but only abnormal results are displayed) Labs Reviewed  RESP PANEL BY RT-PCR (RSV, FLU A&B, COVID)  [id number] - Abnormal; Notable for the following components:      Result Value   SARS  Coronavirus 2 by RT PCR POSITIVE (*)    All other components within normal limits  CBC  BASIC METABOLIC PANEL  BRAIN NATRIURETIC PEPTIDE  TROPONIN I (HIGH SENSITIVITY)     EKG Interpreted by me Atrial fibrillation, rate of 83.  Normal axis, normal intervals.  Normal QRS ST segments and T waves.   RADIOLOGY CT chest interpreted by me, no consolidation or effusion.  Radiology report reviewed noting prior granulomatous disease without active infection.   PROCEDURES:  Procedures   MEDICATIONS ORDERED IN ED: Medications  albuterol (PROVENTIL) (2.5 MG/3ML) 0.083% nebulizer solution 5 mg (5 mg Nebulization Given 10/31/22 1210)  predniSONE (DELTASONE) tablet 40 mg (40 mg Oral Given 10/31/22 1209)     IMPRESSION / MDM / ASSESSMENT AND PLAN / ED COURSE  I reviewed the triage vital signs and the nursing notes.  DDx: Bronchitis, persistent pneumonia, COVID/flu, non-STEMI, heart failure, pericardial effusion, anemia  Patient's presentation is most consistent with acute presentation with potential threat to life or bodily function.  Patient sent to the ED due  to persistent bronchitis symptoms after 2 courses of antibiotics for community-acquired pneumonia.  Vital signs are unremarkable, lab work is all reassuring, exam is reassuring.  COVID test is positive.  Due to his age and symptoms, will prescribe Paxlovid.  Will have him adjust rosuvastatin, Eliquis, and fesoterodine during this time.  He is feeling better in the ED after receiving albuterol nebulizer and prednisone.       FINAL CLINICAL IMPRESSION(S) / ED DIAGNOSES   Final diagnoses:  COVID-19 virus infection  Bronchitis  Chronic atrial fibrillation ([place])     Rx / DC Orders   ED Discharge Orders          Ordered    nirmatrelvir & ritonavir (PAXLOVID, 300/100,) 20 x 150 MG & 10 x 100MG  TBPK  2 times daily        10/31/22 1340    predniSONE (DELTASONE) 20 MG tablet  Daily with breakfast        10/31/22 1340     albuterol (PROVENTIL HFA) 108 (90 Base) MCG/ACT inhaler  Every 4 hours PRN        10/31/22 1340             Note:  This document was prepared using Dragon voice recognition software and may include unintentional dictation errors.   Carrie Mew, MD 10/31/22 [phone]

## 2022-10-31 NOTE — Discharge Instructions (Signed)
Go to the emergency department for evaluation of your persistent cough and abnormal chest xray.  The radiologist is recommending a chest CT scan.

## 2022-10-31 NOTE — Discharge Instructions (Addendum)
Take Paxlovid to help relieve your Covid-related symptoms.  You will need to make a few medication adjustments for the next week while taking Paxlovid due to potential medication interactions: Stop taking Crestor (rosuvastatin) for 1 week from today. Decrease Eliquis to 2.5mg  (half-tablet) two times per day for 1 week. Decrease Fesoterodine to 2mg  once daily for 1 week.  Please follow up with your doctor next week for re-evaluation of your symptoms.

## 2022-10-31 NOTE — ED Provider Notes (Signed)
Darren Tran    CSN: [id number] Arrival date & time: 10/31/22  [id number]      History   Chief Complaint Chief Complaint  Patient presents with   Cough    Chills, spitting up flim, low energy - Entered by patient    HPI Darren HOTOP Sr. is a 77 y.o. male.  Patient presents with ongoing cough x 2 months.  He also reports chills and fatigue.  No fever, chest pain, shortness of breath, or other symptoms.  He was seen by his PCP last week; this visit is not available in the patient's chart.   Patient was seen here on 10/03/2022; diagnosed with bronchitis and sinusitis; treated with Augmentin; chest x-ray at that time showed linear opacities in both lungs.  Patient was seen here again on 10/19/2022; diagnosed with bilateral lower lobe pneumonia; treated with Zithromax and cefdinir; x-ray showed "increased bibasilar linear opacities, right greater than left, which may represent atelectasis or infection."  The history is provided by the patient and medical records.    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   Chronic low back pain 07/17/2022   Depression 07/17/2022   Major depressive disorder, single episode, moderate ([place]) 07/17/2022   Major depressive disorder, single episode, unspecified 07/17/2022   Obstructive sleep apnea (adult) (pediatric) 07/17/2022   Polyp of stomach and duodenum 07/17/2022   Anxiety disorder, unspecified 07/17/2022   Anxiety 07/17/2022   Anxiety disorder, unspecified 07/17/2022   Post-traumatic stress disorder, chronic 07/17/2022   Post-traumatic stress  disorder, unspecified 07/17/2022   Posttraumatic stress disorder 07/17/2022   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   Prediabetes 07/17/2022   Sensorineural hearing loss, 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   Acute pain of left shoulder 02/26/2022   S/P insertion of spinal cord stimulator 12/03/2021   Degenerative lumbar spinal stenosis 08/30/2021   DDD (degenerative disc disease), lumbar 06/10/2021   Paroxysmal atrial fibrillation ([place]) 03/04/2021   Spondylosis without myelopathy or radiculopathy, lumbar region 02/08/2021   Anemia 12/06/2020   Thyroid cancer ([place]) 09/26/2020   Iron deficiency anemia 09/26/2020   Gastrointestinal hemorrhage 08/29/2020   Multiple gastric polyps 08/21/2020   OSA on CPAP 08/21/2020   S/P prostatectomy 08/21/2020   Annual physical exam 06/18/2020   Coronary artery disease, non-occlusive 06/18/2020   Dupuytren's contracture of hand 06/18/2020   Lumbar facet arthropathy 06/12/2020   Seasonal allergic rhinitis due to pollen 12/30/2019   Sacroiliitis ([place]) 09/01/2019   Family history of malignant neoplasm of gastrointestinal tract    Polyp of sigmoid colon    Hyperlipidemia 09/09/2018   Essential hypertension 09/09/2018   Overactive bladder 09/09/2018   Personal history of prostate cancer 08/26/2017   Urinary frequency 08/26/2017   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 Authorizing Provider  apixaban (ELIQUIS) 5 MG TABS tablet Take 5 mg by mouth 2 (two) times daily. Patient takes 1 tablet 1 tablet in the morning and 1 tablet at bedtime    [provider]  atenolol (TENORMIN) 50 MG tablet TAKE 1 TABLET DAILY 02/15/21   Cletis Athens, MD  atenolol (TENORMIN) 50 MG tablet TAKE ONE TABLET BY MOUTH DAILY FOR HEART 07/02/09   [provider]  azelastine (ASTELIN) 0.1 % nasal spray USE 1 SPRAY IN EACH NOSTRIL  PRN 04/30/21   [provider]  azithromycin (ZITHROMAX) 250 MG tablet Take 1 tablet (250 mg total) by mouth daily. Take first 2 tablets together, then 1 every day until finished. Patient not taking: Reported on 10/31/2022 10/19/22   Sharion Balloon, NP  Cholecalciferol 25 MCG (1000 UT) tablet Take by mouth. 04/30/21   [provider]  citalopram (CELEXA) 40 MG tablet TAKE 1.5 TABLETS BY MOUTH ONCE EVERY DAY 11/07/09   [provider]  citalopram (CELEXA) 40 MG tablet TAKE 1.5 TABLETS BY MOUTH ONCE EVERY DAY 11/07/09   [provider]  cyanocobalamin (VITAMIN B12) 500 MCG tablet Take 4 tablets by mouth daily. 04/30/21   [provider]  Docusate Sodium (DSS) 100 MG CAPS TAKE 2 CAPSULES BY MOUTH AS DIRECTED BY YOUR PROVIDER 04/30/21   [provider]  DULoxetine (CYMBALTA) 60 MG capsule TAKE ONE CAPSULE BY MOUTH IN THE MORNING FOR MOOD 06/20/21   [provider]  ezetimibe (ZETIA) 10 MG tablet Take 1 tablet by mouth daily. 07/02/09   [provider]  fenofibrate 160 MG tablet TAKE ONE TABLET BY MOUTH EVERY OTHER DAY HYPERLIPIDEMIA 09/13/21   [provider]  fenofibrate 160 MG tablet TAKE ONE TABLET BY MOUTH EVERY OTHER DAY HYPERLIPIDEMIA 03/21/22   [provider]  Ferrous Gluconate-C-Folic Acid (IRON-C PO) Take 65 mg by mouth daily at 2 PM.    [provider]  fesoterodine (TOVIAZ) 4 MG TB24 tablet Take 1 tablet (4 mg  total) by mouth daily. 09/24/22   Stoioff, Ronda Fairly, MD  ipratropium (ATROVENT) 0.06 % nasal spray SPRAY 2 SPRAYS INTO EACH NOSTRIL THREE TIMES A DAY AS NEEDED FOR NASAL CONGESTION 09/08/22   [provider]  levothyroxine (SYNTHROID) 200 MCG tablet TAKE ONE TABLET BY MOUTH EVERY MORNING HYPOTHYROIDISM 08/29/21   [provider]  Melatonin 5 MG TABS Take by mouth at bedtime as needed.    [provider]  mirabegron ER (MYRBETRIQ) 50 MG TB24 tablet Take 1 tablet by mouth daily. 09/20/21   [provider]  mometasone (NASONEX) 50 MCG/ACT nasal spray USE 1 SPRAY IN EACH NOSTRIL  PRN 04/30/21   [provider]  Multiple Vitamin (MULTIVITAMIN) capsule Take by mouth.    [provider]  prazosin (MINIPRESS) 1 MG capsule TAKE 3 CAPSULES AT BEDTIME Patient taking differently: Take 3 mg by mouth at bedtime. 11/06/20   Cletis Athens, MD  rosuvastatin (CRESTOR) 10 MG tablet TAKE 1 TABLET DAILY 09/15/21   Cletis Athens, MD  traZODone (DESYREL) 50 MG tablet Take 1-2 tablets by mouth at bedtime.  09/07/09   [provider]  tretinoin (RETIN-A) 0.05 % cream  09/03/18   [provider]  vitamin B-12 (CYANOCOBALAMIN) 500 MCG tablet 4 tablets (2,000 mcg total). 04/30/21   [provider]    Family History Family History  Problem Relation Age of Onset   Prostate cancer Neg Hx    Bladder Cancer Neg Hx    Kidney cancer Neg Hx     Social History Social History   Tobacco Use   Smoking status: Never   Smokeless tobacco: Never  Vaping Use   Vaping Use: Never used  Substance Use Topics   Alcohol use: No    Comment: very occasional   Drug use: No     Allergies   Patient has no known allergies.   Review of Systems Review of Systems  Constitutional:  Positive for chills and fatigue. Negative for fever.  HENT:  Negative for ear pain and sore throat.   Respiratory:  Positive for cough. Negative for shortness of breath.    Cardiovascular:  Negative for chest pain and palpitations.  All other systems reviewed and are negative.    Physical Exam Triage Vital Signs ED Triage Vitals  Enc Vitals Group     BP      Pulse      Resp      Temp      Temp src      SpO2      Weight      Height      Head Circumference      Peak Flow      Pain Score      Pain Loc      Pain Edu?      Excl. in [place]?    No data found.  Updated Vital Signs BP 106/76   Pulse 78   Temp 97.9 F (36.6 C)   Resp 18   Ht 6' (1.829 m)   Wt 230 lb (104.3 kg)   SpO2 94%   BMI 31.19 kg/m   Visual Acuity Right Eye Distance:   Left Eye Distance:   Bilateral Distance:    Right Eye Near:   Left Eye Near:    Bilateral Near:     Physical Exam Vitals and nursing note reviewed.  Constitutional:      General: He is not in acute distress.    Appearance: Normal appearance. He is well-developed. He is not ill-appearing.  HENT:     Right Ear: Tympanic membrane normal.     Left Ear: Tympanic membrane normal.     Nose: Nose normal.     Mouth/Throat:     Mouth: Mucous membranes are moist.     Pharynx: Oropharynx is clear.  Cardiovascular:     Rate and Rhythm: Normal rate and regular rhythm.     Heart sounds: Normal heart sounds.  Pulmonary:     Effort: Pulmonary effort is normal. No respiratory distress.     Breath sounds: Normal breath sounds.  Musculoskeletal:     Cervical back: Neck supple.  Skin:    General: Skin is warm and dry.  Neurological:     Mental Status: He is alert.  Psychiatric:        Mood and Affect: Mood normal.        Behavior: Behavior normal.      UC Treatments / Results  Labs (all labs ordered are listed, but only abnormal results are displayed) Labs Reviewed - No data to display  EKG  Radiology DG Chest 2 View  Result Date: 10/31/2022 CLINICAL DATA:  Provided history: Persistent cough. Recent history of pneumonia. Chills. Decreased energy. EXAM: CHEST - 2 VIEW COMPARISON:  Prior chest  radiographs 10/19/2022 and earlier. FINDINGS: Heart size within normal limits. Aortic atherosclerosis. Persistent ill-defined/linear opacities within the right lung base and ill-defined opacities within the left lung base. No evidence of pleural effusion or pneumothorax. No acute osseous abnormality identified. Degenerative changes of the spine. Spinal stimulator leads. These results will be called to the ordering clinician or representative by the Radiologist Assistant, and communication documented in the PACS or [employer]. IMPRESSION: Persistent ill-defined/linear opacities within the right lung base, and ill-defined opacities within the left lung base. While this could reflect atelectasis and/or scarring, superimposed pneumonia cannot be excluded. Given the duration of the patient's symptoms, a chest CT is recommended for further evaluation. Aortic Atherosclerosis (ICD10-I70.0). Electronically Signed   By: Kellie Simmering D.O.   On: 10/31/2022 10:45    Procedures Procedures (including critical care time)  Medications Ordered in UC Medications - No data to display  Initial Impression / Assessment and Plan / UC Course  I have reviewed the triage vital signs and the nursing notes.  Pertinent labs & imaging results that were available during my care of the patient were reviewed by me and considered in my medical decision making (see chart for details).    Persistent cough, abnormal CXR.  Sending patient to the ED for evaluation.  The chest x-ray remains abnormal and the radiologist is recommending chest CT.  Patient is agreeable to this.  He is stable and will drive himself to the ED.  Final Clinical Impressions(s) / UC Diagnoses   Final diagnoses:  Persistent cough  Abnormal chest x-ray     Discharge Instructions      Go to the emergency department for evaluation of your persistent cough and abnormal chest xray.  The radiologist is recommending a chest CT scan.       ED  Prescriptions   None    PDMP not reviewed this encounter.   Sharion Balloon, NP 10/31/22 1053

## 2022-10-31 NOTE — ED Triage Notes (Signed)
Pt presents to ED from PCP with c/o of "weird looking chest XRAY". PCP recommends a CT scan. Pt denies fevers or chills. Pt is A&Ox4. Pt does endorse a cough.

## 2022-10-31 NOTE — ED Triage Notes (Signed)
Patient to Urgent Care with complaints of productive cough, chills, and low energy. Symptoms started two months ago. Denies any recent fevers.   Reports phlegm has not cleared after previous treatments with antibiotic prescriptions. Not currently taking other otc cough medications/

## 2022-11-11 ENCOUNTER — Telehealth: Payer: Self-pay

## 2022-11-11 NOTE — Telephone Encounter (Signed)
Pt called requesting to schedule colonoscopy 4 yr recall... I explained that it is not due until 2025 per letter in chart... Pt proceeded to say he has been experiencing rectal bleeding, after reviewing his chart, I see that he is established with [facility], had an EGD done with them 03/2022 and is recommended to have another 03/2023... When I asked pt if he reached out to [facility] as he is established with them, he said "never mind just forget it" I told pt that I could send a message to Dr Allen Norris for advice, pt said "never mind I will see my PCP at the [place] and they will have to send another referral to GI." I told pt to give me a call back if he changed his mind to send a msg to Dr Allen Norris... I explained that we could not just schedule colonoscopy without approval from Dr as it is not due until 2025, he may need OV. Pt thanked me and said to hold off at this time

## 2023-08-20 ENCOUNTER — Ambulatory Visit
Admission: EM | Admit: 2023-08-20 | Discharge: 2023-08-20 | Disposition: A | Discharge status: Home / Self Care | Payer: Medicare Other | Attending: Emergency Medicine | Admitting: Emergency Medicine

## 2023-08-20 DIAGNOSIS — J069 Acute upper respiratory infection, unspecified: Secondary | ICD-10-CM | POA: Diagnosis not present

## 2023-08-20 DIAGNOSIS — R062 Wheezing: Secondary | ICD-10-CM | POA: Diagnosis not present

## 2023-08-20 MED ORDER — BENZONATATE 100 MG PO CAPS: 100.0000 mg | ORAL_CAPSULE | Freq: Three times a day (TID) | ORAL | 0 refills | Status: DC

## 2023-08-20 MED ORDER — PREDNISONE 10 MG (21) PO TBPK: ORAL_TABLET | Freq: Every day | ORAL | 0 refills | Status: DC

## 2023-08-20 MED ORDER — AMOXICILLIN-POT CLAVULANATE 875-125 MG PO TABS: 1.0000 | ORAL_TABLET | Freq: Two times a day (BID) | ORAL | 0 refills | Status: AC

## 2023-08-20 MED ORDER — PROMETHAZINE-DM 6.25-15 MG/5ML PO SYRP: 5.0000 mL | ORAL_SOLUTION | Freq: Every evening | ORAL | 0 refills | Status: AC | PRN

## 2023-08-20 NOTE — ED Provider Notes (Signed)
 Darren Tran    CSN: [id number] Arrival date & time: 08/20/23  1509      History   Chief Complaint Chief Complaint  Patient presents with   Cough   Nasal Congestion    HPI TADARIUS MALAND Sr. is a 78 y.o. male.   Patient presents for ration of nasal congestion, rhinorrhea, sinus pressure along the bridge of the nose, productive cough expelling yellow to green sputum and intermittent wheezing present for 5 to 6 weeks.  Endorses hoarseness and a rattling to the throat.  Sinus pressure exacerbated by cold air that causes a burning sensation to the nose and elicits cough.  Denies presence of fever.  No known sick contacts prior.  Tolerating food and liquids.  Has attempted use of over-the-counter cold and flu medication.  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   Chronic low back pain 07/17/2022   Depression 07/17/2022   Major depressive disorder, single episode, moderate (HCC) 07/17/2022   Major depressive disorder, single episode, unspecified 07/17/2022   Obstructive sleep apnea (adult) (pediatric) 07/17/2022   Polyp of stomach and duodenum 07/17/2022   Anxiety disorder, unspecified 07/17/2022   Anxiety 07/17/2022   Anxiety disorder, unspecified 07/17/2022   Post-traumatic stress disorder, chronic 07/17/2022   Post-traumatic stress disorder, unspecified 07/17/2022   Posttraumatic stress disorder 07/17/2022   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   Prediabetes 07/17/2022   Sensorineural hearing loss, 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   Acute pain of left shoulder 02/26/2022   S/P  insertion of spinal cord stimulator 12/03/2021   Degenerative lumbar spinal stenosis 08/30/2021   DDD (degenerative disc disease), lumbar 06/10/2021   Paroxysmal atrial fibrillation (HCC) 03/04/2021   Spondylosis without myelopathy or radiculopathy, lumbar region 02/08/2021   Anemia 12/06/2020   Thyroid  cancer (HCC) 09/26/2020   Iron deficiency anemia 09/26/2020   Gastrointestinal hemorrhage 08/29/2020   Multiple gastric polyps 08/21/2020   OSA on CPAP 08/21/2020   S/P prostatectomy 08/21/2020   Annual physical exam 06/18/2020   Coronary artery disease, non-occlusive 06/18/2020   Dupuytren's contracture of hand 06/18/2020   Lumbar facet arthropathy 06/12/2020   Seasonal allergic rhinitis due to pollen 12/30/2019   Sacroiliitis (HCC) 09/01/2019   Family history of malignant neoplasm of gastrointestinal tract    Polyp of sigmoid colon    Hyperlipidemia 09/09/2018   Essential hypertension 09/09/2018   Overactive bladder 09/09/2018   Personal history of prostate cancer 08/26/2017   Urinary frequency 08/26/2017   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    Past Surgical History:  Procedure Laterality Date   COLONOSCOPY WITH PROPOFOL  N/A 01/24/2019   Procedure: COLONOSCOPY WITH BIOPSY;  Surgeon: Jinny Carmine, MD;  Location: [facility];  Service: Endoscopy;  Laterality: N/A;  sleep apnea   KNEE ARTHROSCOPY     NOSE SURGERY     POLYPECTOMY N/A 01/24/2019   Procedure: POLYPECTOMY;  Surgeon: Jinny Carmine, MD;  Location: [facility];  Service: Endoscopy;  Laterality: N/A;   PROSTATECTOMY     SHOULDER ARTHROSCOPY     WISDOM TOOTH EXTRACTION  Home Medications    Prior to Admission medications   Medication Sig Start Date End Date Taking? Authorizing Provider  albuterol  (PROVENTIL  HFA) 108 (90 Base) MCG/ACT inhaler Inhale 2 puffs into the lungs every 4 (four) hours as needed for wheezing or shortness of breath. 10/31/22  Yes Viviann Pastor, MD   amoxicillin -clavulanate (AUGMENTIN ) 875-125 MG tablet Take 1 tablet by mouth every 12 (twelve) hours for 10 days. 08/20/23 08/30/23 Yes Unknown Flannigan, Shelba SAUNDERS, NP  apixaban (ELIQUIS) 5 MG TABS tablet Take 5 mg by mouth 2 (two) times daily. Patient takes 1 tablet 1 tablet in the morning and 1 tablet at bedtime   Yes [provider]  atenolol (TENORMIN) 50 MG tablet TAKE 1 TABLET DAILY 02/15/21  Yes Masoud, Javed, MD  azelastine (ASTELIN) 0.1 % nasal spray USE 1 SPRAY IN EACH NOSTRIL  PRN 04/30/21  Yes [provider]  azithromycin  (ZITHROMAX ) 250 MG tablet Take 1 tablet (250 mg total) by mouth daily. Take first 2 tablets together, then 1 every day until finished. 10/19/22  Yes Corlis Burnard DEL, NP  benzonatate  (TESSALON ) 100 MG capsule Take 1 capsule (100 mg total) by mouth every 8 (eight) hours. 08/20/23  Yes Teresa Shelba SAUNDERS, NP  Cholecalciferol 25 MCG (1000 UT) tablet Take by mouth. 04/30/21  Yes [provider]  citalopram (CELEXA) 40 MG tablet TAKE 1.5 TABLETS BY MOUTH ONCE EVERY DAY 11/07/09  Yes [provider]  cyanocobalamin (VITAMIN B12) 500 MCG tablet Take 4 tablets by mouth daily. 04/30/21  Yes [provider]  DULoxetine (CYMBALTA) 60 MG capsule TAKE ONE CAPSULE BY MOUTH IN THE MORNING FOR MOOD 06/20/21  Yes [provider]  ezetimibe (ZETIA) 10 MG tablet Take 1 tablet by mouth daily. 07/02/09  Yes [provider]  fenofibrate 160 MG tablet TAKE ONE TABLET BY MOUTH EVERY OTHER DAY HYPERLIPIDEMIA 09/13/21  Yes [provider]  Ferrous Gluconate-C-Folic Acid (IRON-C PO) Take 65 mg by mouth daily at 2 PM.   Yes [provider]  fesoterodine  (TOVIAZ ) 4 MG TB24 tablet Take 1 tablet (4 mg total) by mouth daily. 09/24/22  Yes Stoioff, Glendia BROCKS, MD  levothyroxine (SYNTHROID) 200 MCG tablet TAKE ONE TABLET BY MOUTH EVERY MORNING HYPOTHYROIDISM 08/29/21  Yes [provider]  Melatonin 5 MG TABS Take by mouth at bedtime as needed.    Yes [provider]  mirabegron  ER (MYRBETRIQ ) 50 MG TB24 tablet Take 1 tablet by mouth daily. 09/20/21  Yes [provider]  Multiple Vitamin (MULTIVITAMIN) capsule Take by mouth.   Yes [provider]  predniSONE  (STERAPRED UNI-PAK 21 TAB) 10 MG (21) TBPK tablet Take by mouth daily. Take 6 tabs by mouth daily  for 1 days, then 5 tabs for 1 days, then 4 tabs for 1 days, then 3 tabs for 1 days, 2 tabs for 1 days, then 1 tab by mouth daily for 1 days 08/20/23  Yes Cobie Leidner R, NP  promethazine -dextromethorphan (PROMETHAZINE -DM) 6.25-15 MG/5ML syrup Take 5 mLs by mouth at bedtime as needed. 08/20/23  Yes Leor Whyte R, NP  rosuvastatin  (CRESTOR ) 10 MG tablet TAKE 1 TABLET DAILY 09/15/21  Yes Masoud, Javed, MD  traZODone (DESYREL) 50 MG tablet Take 1-2 tablets by mouth at bedtime. 09/07/09  Yes [provider]  atenolol (TENORMIN) 50 MG tablet TAKE ONE TABLET BY MOUTH DAILY FOR HEART 07/02/09   [provider]  citalopram (CELEXA) 40 MG tablet TAKE 1.5 TABLETS BY MOUTH ONCE EVERY DAY 11/07/09  [provider]  Docusate Sodium (DSS) 100 MG CAPS TAKE 2 CAPSULES BY MOUTH AS DIRECTED BY YOUR PROVIDER 04/30/21   [provider]  fenofibrate 160 MG tablet TAKE ONE TABLET BY MOUTH EVERY OTHER DAY HYPERLIPIDEMIA 03/21/22   [provider]  ipratropium (ATROVENT ) 0.06 % nasal spray SPRAY 2 SPRAYS INTO EACH NOSTRIL THREE TIMES A DAY AS NEEDED FOR NASAL CONGESTION 09/08/22   [provider]  mometasone (NASONEX) 50 MCG/ACT nasal spray USE 1 SPRAY IN EACH NOSTRIL  PRN 04/30/21   [provider]  prazosin (MINIPRESS) 1 MG capsule TAKE 3 CAPSULES AT BEDTIME Patient taking differently: Take 3 mg by mouth at bedtime. 11/06/20   Masoud, Javed, MD  tretinoin (RETIN-A) 0.05 % cream  09/03/18   [provider]  vitamin B-12 (CYANOCOBALAMIN) 500 MCG tablet 4 tablets (2,000 mcg total). 04/30/21   [provider]     Family History Family History  Problem Relation Age of Onset   Prostate cancer Neg Hx    Bladder Cancer Neg Hx    Kidney cancer Neg Hx     Social History Social History   Tobacco Use   Smoking status: Never   Smokeless tobacco: Never  Vaping Use   Vaping status: Never Used  Substance Use Topics   Alcohol use: No    Comment: very occasional   Drug use: No     Allergies   Patient has no known allergies.   Review of Systems Review of Systems   Physical Exam Triage Vital Signs ED Triage Vitals [08/20/23 1547]  Encounter Vitals Group     BP 112/73     Systolic BP Percentile      Diastolic BP Percentile      Pulse Rate 83     Resp 18     Temp 98.9 F (37.2 C)     Temp Source Oral     SpO2 93 %     Weight      Height      Head Circumference      Peak Flow      Pain Score 0     Pain Loc      Pain Education      Exclude from Growth Chart    No data found.  Updated Vital Signs BP 112/73 (BP Location: Left Arm)   Pulse 83   Temp 98.9 F (37.2 C) (Oral)   Resp 18   SpO2 93%   Visual Acuity Right Eye Distance:   Left Eye Distance:   Bilateral Distance:    Right Eye Near:   Left Eye Near:    Bilateral Near:     Physical Exam Constitutional:      Appearance: Normal appearance.  HENT:     Head: Normocephalic.     Right Ear: Tympanic membrane, ear canal and external ear normal.     Left Ear: Tympanic membrane, ear canal and external ear normal.     Nose: Congestion present. No rhinorrhea.     Mouth/Throat:     Mouth: Mucous membranes are moist.     Pharynx: Oropharynx is clear. No posterior oropharyngeal erythema.  Eyes:     Extraocular Movements: Extraocular movements intact.  Cardiovascular:     Rate and Rhythm: Normal rate and regular rhythm.     Pulses: Normal pulses.     Heart sounds: Normal heart sounds.  Pulmonary:     Effort: Pulmonary effort is normal.     Breath sounds: Normal  breath sounds.     Comments: Wheezing present only  when coughing Musculoskeletal:     Cervical back: Normal range of motion and neck supple.  Skin:    General: Skin is warm and dry.  Neurological:     Mental Status: He is alert and oriented to person, place, and time. Mental status is at baseline.      UC Treatments / Results  Labs (all labs ordered are listed, but only abnormal results are displayed) Labs Reviewed - No data to display  EKG   Radiology No results found.  Procedures Procedures (including critical care time)  Medications Ordered in UC Medications - No data to display  Initial Impression / Assessment and Plan / UC Course  I have reviewed the triage vital signs and the nursing notes.  Pertinent labs & imaging results that were available during my care of the patient were reviewed by me and considered in my medical decision making (see chart for details).  Acute URI with cough  Patient is in no signs of distress nor toxic appearing.  Vital signs are stable.   Prescribed Augmentin  and prednisone  as symptoms have been present for greater than 1 month and wheezing is heard with coughing.  As lungs are clear will defer imaging at this time.May use additional over-the-counter medications as needed for supportive care.  May follow-up with urgent care as needed if symptoms persist or worsen.  .   Final Clinical Impressions(s) / UC Diagnoses   Final diagnoses:  Acute URI  Wheezing     Discharge Instructions      Begin augmentin  twice a day for 10 days  Begin insulin every morning with food as directed to open and relax the airway, should settle wheezing that was heard while on exam however your lungs are clear at this time  You may use Tessalon  pill every 8 hours to help with your coughing, may use cough syrup at bedtime to help you rest    You can take Tyleno as needed for fever reduction and pain relief.   For cough: honey 1/2 to 1 teaspoon (you can dilute the honey in water  or another fluid).  You can  also use guaifenesin and dextromethorphan for cough. You can use a humidifier for chest congestion and cough.  If you don't have a humidifier, you can sit in the bathroom with the hot shower running.      For sore throat: try warm salt water  gargles, cepacol lozenges, throat spray, warm tea or water  with lemon/honey, popsicles or ice, or OTC cold relief medicine for throat discomfort.   For congestion: take a daily anti-histamine like Zyrtec, Claritin , and a oral decongestant, such as pseudoephedrine.  You can also use Flonase  1-2 sprays in each nostril daily.   It is important to stay hydrated: drink plenty of fluids (water , gatorade/powerade/pedialyte, juices, or teas) to keep your throat moisturized and help further relieve irritation/discomfort.    ED Prescriptions     Medication Sig Dispense Auth. Provider   amoxicillin -clavulanate (AUGMENTIN ) 875-125 MG tablet Take 1 tablet by mouth every 12 (twelve) hours for 10 days. 20 tablet Lyman Balingit R, NP   predniSONE  (STERAPRED UNI-PAK 21 TAB) 10 MG (21) TBPK tablet Take by mouth daily. Take 6 tabs by mouth daily  for 1 days, then 5 tabs for 1 days, then 4 tabs for 1 days, then 3 tabs for 1 days, 2 tabs for 1 days, then 1 tab by mouth daily for 1 days 21  tablet Sathvika Ojo R, NP   benzonatate  (TESSALON ) 100 MG capsule Take 1 capsule (100 mg total) by mouth every 8 (eight) hours. 21 capsule Vivi Piccirilli R, NP   promethazine -dextromethorphan (PROMETHAZINE -DM) 6.25-15 MG/5ML syrup Take 5 mLs by mouth at bedtime as needed. 118 mL Amarya Kuehl, Shelba SAUNDERS, NP      PDMP not reviewed this encounter.   Teresa Shelba SAUNDERS, NP 08/20/23 1642

## 2023-08-20 NOTE — ED Triage Notes (Signed)
 Runny nose, watery eyes, sneezing, sinus pressure, cough x 5-6 weeks.   Taking OTC allergy medication with no relief of symptoms.

## 2023-08-20 NOTE — Discharge Instructions (Signed)
 Begin augmentin  twice a day for 10 days  Begin insulin every morning with food as directed to open and relax the airway, should settle wheezing that was heard while on exam however your lungs are clear at this time  You may use Tessalon  pill every 8 hours to help with your coughing, may use cough syrup at bedtime to help you rest    You can take Tyleno as needed for fever reduction and pain relief.   For cough: honey 1/2 to 1 teaspoon (you can dilute the honey in water  or another fluid).  You can also use guaifenesin and dextromethorphan for cough. You can use a humidifier for chest congestion and cough.  If you don't have a humidifier, you can sit in the bathroom with the hot shower running.      For sore throat: try warm salt water  gargles, cepacol lozenges, throat spray, warm tea or water  with lemon/honey, popsicles or ice, or OTC cold relief medicine for throat discomfort.   For congestion: take a daily anti-histamine like Zyrtec, Claritin , and a oral decongestant, such as pseudoephedrine.  You can also use Flonase  1-2 sprays in each nostril daily.   It is important to stay hydrated: drink plenty of fluids (water , gatorade/powerade/pedialyte, juices, or teas) to keep your throat moisturized and help further relieve irritation/discomfort.

## 2023-08-22 ENCOUNTER — Other Ambulatory Visit: Payer: Self-pay | Admitting: Nurse Practitioner

## 2023-08-26 ENCOUNTER — Telehealth: Payer: Self-pay | Admitting: Urology

## 2023-08-26 ENCOUNTER — Other Ambulatory Visit: Payer: Self-pay | Admitting: Internal Medicine

## 2023-08-26 NOTE — Telephone Encounter (Signed)
 We have dont got anything, They need to sent us  something to sign or get [facility] to sent us  a refill request . Plus he has to been seen on his appt date to refill

## 2023-08-26 NOTE — Telephone Encounter (Signed)
 [name] called asking about her husbands refill of Toviaz  4mg . [facility] sent in the fax on 1/11. Just need signature and sent back to they can fill.

## 2023-08-26 NOTE — Telephone Encounter (Signed)
Copied from CRM [id number]. Topic: Clinical - Medication Refill >> Aug 26, 2023  2:05 PM Drema Balzarine wrote: Most Recent Primary Care Visit:   Medication: Fesoterodine (TOVIAZ) 4 MG TB24 tablet  Has the patient contacted their pharmacy? Yes, needs Kara Dies, NP name taken off script   (Agent: If no, request that the patient contact the pharmacy for the refill. If patient does not wish to contact the pharmacy document the reason why and proceed with request.) (Agent: If yes, when and what did the pharmacy advise?)  Is this the correct pharmacy for this prescription? Yes If no, delete pharmacy and type the correct one.  This is the patient's preferred pharmacy:   [facility] - [place], [place] - [address] [place] [address] Phone: [phone] Fax: [phone]   Has the prescription been filled recently? No  Is the patient out of the medication? Yes  Has the patient been seen for an appointment in the last year OR does the patient have an upcoming appointment? No, I'm curious if this refill should be sent to Dr. Virl Diamond?  Can we respond through MyChart? No  Agent: Please be advised that Rx refills may take up to 3 business days. We ask that you follow-up with your pharmacy.

## 2023-08-26 NOTE — Telephone Encounter (Signed)
 Copied from CRM [id number]. Topic: Clinical - Medication Refill >> Aug 26, 2023  2:05 PM Artemio Larry wrote: Most Recent Primary Care Visit:   Medication: Fesoterodine  (TOVIAZ ) 4 MG TB24 tablet  Has the patient contacted their pharmacy? Yes, needs Tona Francis, NP name taken off script   (Agent: If no, request that the patient contact the pharmacy for the refill. If patient does not wish to contact the pharmacy document the reason why and proceed with request.) (Agent: If yes, when and what did the pharmacy advise?)  Is this the correct pharmacy for this prescription? Yes If no, delete pharmacy and type the correct one.  This is the patient's preferred pharmacy:   [facility] - [place], [place] - [address] [place] [address] Phone: [phone] Fax: [phone]   Has the prescription been filled recently? No  Is the patient out of the medication? Yes  Has the patient been seen for an appointment in the last year OR does the patient have an upcoming appointment?   Can we respond through MyChart?   Agent: Please be advised that Rx refills may take up to 3 business days. We ask that you follow-up with your pharmacy.

## 2023-08-26 NOTE — Telephone Encounter (Signed)
 Urology office is handling.

## 2023-09-14 ENCOUNTER — Other Ambulatory Visit: Payer: Self-pay | Admitting: *Deleted

## 2023-09-14 MED ORDER — FESOTERODINE FUMARATE ER 4 MG PO TB24: 4.0000 mg | ORAL_TABLET | Freq: Every day | ORAL | 3 refills | Status: AC

## 2023-09-24 ENCOUNTER — Ambulatory Visit: Payer: Medicare Other | Admitting: Urology

## 2023-09-24 VITALS — BP 105/62 | HR 86 | Ht 72.0 in | Wt 230.0 lb

## 2023-09-24 DIAGNOSIS — N3281 Overactive bladder: Secondary | ICD-10-CM

## 2023-09-24 DIAGNOSIS — Z8546 Personal history of malignant neoplasm of prostate: Secondary | ICD-10-CM

## 2023-09-25 NOTE — Progress Notes (Signed)
Darren Tran presented today for his annual follow-up however I was running behind and he had to leave to take his wife to an appointment.  He let our CMA know he was doing well and having no complaints.  No longer on Myrbetriq and taking fesoterodine.  Will reschedule for 1 year and he will call if he needs to be seen earlier.

## 2023-10-06 ENCOUNTER — Ambulatory Visit: Payer: Medicare Other | Admitting: Nurse Practitioner

## 2023-10-06 ENCOUNTER — Encounter: Payer: Self-pay | Admitting: Nurse Practitioner

## 2023-10-06 VITALS — BP 110/80 | HR 93 | Temp 98.3°F | Resp 16 | Ht 72.0 in | Wt 236.8 lb

## 2023-10-06 DIAGNOSIS — I480 Paroxysmal atrial fibrillation: Secondary | ICD-10-CM | POA: Diagnosis not present

## 2023-10-06 DIAGNOSIS — Z8551 Personal history of malignant neoplasm of bladder: Secondary | ICD-10-CM

## 2023-10-06 DIAGNOSIS — F3340 Major depressive disorder, recurrent, in remission, unspecified: Secondary | ICD-10-CM

## 2023-10-06 DIAGNOSIS — L309 Dermatitis, unspecified: Secondary | ICD-10-CM

## 2023-10-06 DIAGNOSIS — E890 Postprocedural hypothyroidism: Secondary | ICD-10-CM

## 2023-10-06 DIAGNOSIS — Z85850 Personal history of malignant neoplasm of thyroid: Secondary | ICD-10-CM

## 2023-10-06 DIAGNOSIS — I10 Essential (primary) hypertension: Secondary | ICD-10-CM

## 2023-10-06 DIAGNOSIS — G4733 Obstructive sleep apnea (adult) (pediatric): Secondary | ICD-10-CM

## 2023-10-06 DIAGNOSIS — Z8546 Personal history of malignant neoplasm of prostate: Secondary | ICD-10-CM

## 2023-10-06 DIAGNOSIS — N3281 Overactive bladder: Secondary | ICD-10-CM

## 2023-10-06 MED ORDER — TRIAMCINOLONE ACETONIDE 0.1 % EX CREA: 1.0000 | TOPICAL_CREAM | Freq: Two times a day (BID) | CUTANEOUS | 4 refills | Status: AC

## 2023-10-06 NOTE — Progress Notes (Signed)
 [facility] [address] [place], [place] [address]  Internal MEDICINE  Office Visit Note  Patient Name: Darren Tran  [id number]  [id number]  Date of Service: 10/06/2023   Complaints/HPI Pt is here for establishment of PCP. Chief Complaint  Patient presents with   New Patient (Initial Visit)    Voice sounds changes,     HPI Darren Tran presents for a new patient visit to establish care.  Well-appearing 78 y.o. male with AFIB, hypertension, postsurgical hypothyroidism, OAB, OSA and History of thyroid cancer, prostate cancer, knee atrthritis, tens unit surgically placed in back.  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  Home: lives at home with wife  Diet: fair  Exercise: goes to [employer] sometimes and silver sneakers.  Tobacco use: none  Alcohol use: 2 beers every 6 months Illicit drug use: none  Routine CRC screening: done in 2024 by the [facility] doctor, and also had a colonoscopy done in 2020.  Labs: due for labs esp thyroid New or worsening pain: chronic arthritic pain  Goes to the [place] for a lot of his medical care Urologist -- Dr. Lonna Cobb -- OAB On levothyroxine post thyroidectomy.    Current Medication: Outpatient Encounter Medications as of 10/06/2023  Medication Sig   albuterol (PROVENTIL HFA) 108 (90 Base) MCG/ACT inhaler Inhale 2 puffs into the lungs every 4 (four) hours as needed for wheezing or shortness of breath.   apixaban (ELIQUIS) 5 MG TABS tablet Take 5 mg by mouth 2 (two) times daily. Patient takes 1 tablet 1 tablet in the morning and 1 tablet at bedtime   atenolol (TENORMIN) 50 MG tablet TAKE 1 TABLET DAILY   azelastine (ASTELIN) 0.1 % nasal spray USE 1 SPRAY IN EACH NOSTRIL  PRN   Cholecalciferol 25 MCG (1000 UT) tablet Take by mouth.   cyanocobalamin (VITAMIN B12) 500 MCG tablet Take 4 tablets by mouth daily.   Docusate Sodium (DSS) 100 MG CAPS TAKE 2 CAPSULES BY MOUTH AS DIRECTED BY YOUR PROVIDER   DULoxetine (CYMBALTA)  30 MG capsule Take 30 mg by mouth daily.   ezetimibe (ZETIA) 10 MG tablet Take 1 tablet by mouth daily.   fenofibrate 160 MG tablet TAKE ONE TABLET BY MOUTH EVERY OTHER DAY HYPERLIPIDEMIA   Ferrous Gluconate-C-Folic Acid (IRON-C PO) Take 65 mg by mouth daily at 2 PM.   fesoterodine (TOVIAZ) 4 MG TB24 tablet Take 1 tablet (4 mg total) by mouth daily.   ipratropium (ATROVENT) 0.06 % nasal spray SPRAY 2 SPRAYS INTO EACH NOSTRIL THREE TIMES A DAY AS NEEDED FOR NASAL CONGESTION   levothyroxine (SYNTHROID) 200 MCG tablet TAKE ONE TABLET BY MOUTH EVERY MORNING HYPOTHYROIDISM   Melatonin 5 MG TABS Take by mouth at bedtime as needed.   mometasone (NASONEX) 50 MCG/ACT nasal spray USE 1 SPRAY IN EACH NOSTRIL  PRN   Multiple Vitamin (MULTIVITAMIN) capsule Take by mouth.   prazosin (MINIPRESS) 1 MG capsule TAKE 3 CAPSULES AT BEDTIME (Patient taking differently: Take 3 mg by mouth at bedtime.)   promethazine-dextromethorphan (PROMETHAZINE-DM) 6.25-15 MG/5ML syrup Take 5 mLs by mouth at bedtime as needed.   rosuvastatin (CRESTOR) 10 MG tablet TAKE 1 TABLET DAILY   tretinoin (RETIN-A) 0.05 % cream    triamcinolone cream (KENALOG) 0.1 % Apply 1 Application topically 2 (two) times daily. To affected areas until resolved   vitamin B-12 (CYANOCOBALAMIN) 500 MCG tablet 4 tablets (2,000 mcg total).   [DISCONTINUED] atenolol (TENORMIN) 50 MG tablet TAKE ONE  TABLET BY MOUTH DAILY FOR HEART   [DISCONTINUED] azithromycin (ZITHROMAX) 250 MG tablet Take 1 tablet (250 mg total) by mouth daily. Take first 2 tablets together, then 1 every day until finished.   [DISCONTINUED] benzonatate (TESSALON) 100 MG capsule Take 1 capsule (100 mg total) by mouth every 8 (eight) hours.   [DISCONTINUED] citalopram (CELEXA) 40 MG tablet TAKE 1.5 TABLETS BY MOUTH ONCE EVERY DAY   [DISCONTINUED] citalopram (CELEXA) 40 MG tablet TAKE 1.5 TABLETS BY MOUTH ONCE EVERY DAY   [DISCONTINUED] DULoxetine (CYMBALTA) 60 MG capsule TAKE ONE CAPSULE BY  MOUTH IN THE MORNING FOR MOOD   [DISCONTINUED] fenofibrate 160 MG tablet TAKE ONE TABLET BY MOUTH EVERY OTHER DAY HYPERLIPIDEMIA   [DISCONTINUED] predniSONE (STERAPRED UNI-PAK 21 TAB) 10 MG (21) TBPK tablet Take by mouth daily. Take 6 tabs by mouth daily  for 1 days, then 5 tabs for 1 days, then 4 tabs for 1 days, then 3 tabs for 1 days, 2 tabs for 1 days, then 1 tab by mouth daily for 1 days   [DISCONTINUED] traZODone (DESYREL) 50 MG tablet Take 1-2 tablets by mouth at bedtime.   No facility-administered encounter medications on file as of 10/06/2023.    Surgical History: Past Surgical History:  Procedure Laterality Date   COLONOSCOPY WITH PROPOFOL N/A 01/24/2019   Procedure: COLONOSCOPY WITH BIOPSY;  Surgeon: Midge Minium, MD;  Location: [facility];  Service: Endoscopy;  Laterality: N/A;  sleep apnea   KNEE ARTHROSCOPY     NOSE SURGERY     POLYPECTOMY N/A 01/24/2019   Procedure: POLYPECTOMY;  Surgeon: Midge Minium, MD;  Location: [facility];  Service: Endoscopy;  Laterality: N/A;   PROSTATECTOMY     SHOULDER ARTHROSCOPY     WISDOM TOOTH EXTRACTION      Medical History: Past Medical History:  Diagnosis Date   Arthritis    Atrial fibrillation (HCC)    GERD (gastroesophageal reflux disease)    History of prostate cancer    Hyperlipidemia 09/09/2018   Personal history of prostate cancer 08/26/2017   Sciatica    bilateral, intermittent   Sleep apnea    CPAP    Family History: Family History  Problem Relation Age of Onset   Prostate cancer Neg Hx    Bladder Cancer Neg Hx    Kidney cancer Neg Hx     Social History   Socioeconomic History   Marital status: Married    Spouse name: International aid/development worker   Number of children: 2   Years of education: 12   Highest education level: 12th grade  Occupational History   Not on file  Tobacco Use   Smoking status: Never   Smokeless tobacco: Never  Vaping Use   Vaping status: Never Used  Substance and Sexual Activity    Alcohol use: No    Comment: very occasional   Drug use: No   Sexual activity: Not Currently    Birth control/protection: None  Other Topics Concern   Not on file  Social History Narrative   Not on file   Social Drivers of Health   Financial Resource Strain: Low Risk  (07/17/2022)   Overall Financial Resource Strain (CARDIA)    Difficulty of Paying Living Expenses: Not hard at all  Food Insecurity: No Food Insecurity (07/17/2022)   Hunger Vital Sign    Worried About Running Out of Food in the Last Year: Never true    Ran Out of Food in the Last Year: Never true  Transportation  Needs: No Transportation Needs (07/17/2022)   PRAPARE - Administrator, Civil Service (Medical): No    Lack of Transportation (Non-Medical): No  Physical Activity: Insufficiently Active (07/17/2022)   Exercise Vital Sign    Days of Exercise per Week: 2 days    Minutes of Exercise per Session: 60 min  Stress: Stress Concern Present (07/17/2022)   [employer] of Occupational Health - Occupational Stress Questionnaire    Feeling of Stress : To some extent  Social Connections: Socially Isolated (07/17/2022)   Social Connection and Isolation Panel [NHANES]    Frequency of Communication with Friends and Family: Twice a week    Frequency of Social Gatherings with Friends and Family: Never    Attends Religious Services: Never    Database administrator or Organizations: No    Attends Banker Meetings: Never    Marital Status: Married  Catering manager Violence: Not At Risk (07/17/2022)   Humiliation, Afraid, Rape, and Kick questionnaire    Fear of Current or Ex-Partner: No    Emotionally Abused: No    Physically Abused: No    Sexually Abused: No     Review of Systems  Constitutional:  Negative for chills, fatigue, fever and unexpected weight change.  HENT:  Negative for congestion, postnasal drip, rhinorrhea, sneezing and sore throat.   Eyes:  Negative for redness.  Respiratory:  Negative.  Negative for cough, chest tightness and shortness of breath.   Cardiovascular: Negative.  Negative for chest pain and palpitations.  Gastrointestinal:  Negative for abdominal pain, constipation, diarrhea, nausea and vomiting.  Genitourinary:  Negative for dysuria and frequency.  Musculoskeletal:  Negative for arthralgias, back pain, joint swelling and neck pain.  Skin:  Negative for rash.  Neurological: Negative.  Negative for tremors and numbness.  Hematological:  Negative for adenopathy. Does not bruise/bleed easily.  Psychiatric/Behavioral:  Negative for behavioral problems (Depression), sleep disturbance and suicidal ideas. The patient is not nervous/anxious.     Vital Signs: BP 110/80   Pulse 93   Temp 98.3 F (36.8 C)   Resp 16   Ht 6' (1.829 m)   Wt 236 lb 12.8 oz (107.4 kg)   SpO2 94%   BMI 32.12 kg/m    Physical Exam Vitals reviewed.  Constitutional:      General: He is not in acute distress.    Appearance: Normal appearance. He is obese. He is not ill-appearing.  HENT:     Head: Normocephalic and atraumatic.  Eyes:     Pupils: Pupils are equal, round, and reactive to light.  Cardiovascular:     Rate and Rhythm: Normal rate and regular rhythm.  Pulmonary:     Effort: Pulmonary effort is normal. No respiratory distress.  Skin:    General: Skin is warm.  Neurological:     Mental Status: He is alert and oriented to person, place, and time.  Psychiatric:        Mood and Affect: Mood normal.        Behavior: Behavior normal.       Assessment/Plan: 1. Essential hypertension (Primary) Stable, Continue atenolol as prescribed.   2. Paroxysmal atrial fibrillation (HCC) Continue atenolol as prescribed.  3. Postoperative hypothyroidism Continue levothyroxine as prescribed.   4. OSA on CPAP Uses CPAP at night, no issues, continue as instructed.   5. Eczema of face Topical steroid prescribed. If no improvement, will refer to dermatology.  -  triamcinolone cream (KENALOG) 0.1 %; Apply 1 Application  topically 2 (two) times daily. To affected areas until resolved  Dispense: 45 g; Refill: 4  6. Overactive bladder Sees urology regularly.   7. History of thyroid cancer S/p thyroidectomy, noted for medical history  8. Personal history of prostate cancer S/p prostatectomy, noted for medical history  9. Recurrent major depression in remission (HCC) Taking duloxetine, continue as prescribed.  - DULoxetine (CYMBALTA) 30 MG capsule; Take 30 mg by mouth daily.    General Counseling: Ahijah verbalizes understanding of the findings of todays visit and agrees with plan of treatment. I have discussed any further diagnostic evaluation that may be needed or ordered today. We also reviewed his medications today. he has been encouraged to call the office with any questions or concerns that should arise related to todays visit.    No orders of the defined types were placed in this encounter.   Meds ordered this encounter  Medications   triamcinolone cream (KENALOG) 0.1 %    Sig: Apply 1 Application topically 2 (two) times daily. To affected areas until resolved    Dispense:  45 g    Refill:  4    Fill new script today    Return in about 3 months (around 01/03/2024) for AWV, Greysen Swanton PCP.  Time spent:30 Minutes Time spent with patient included reviewing progress notes, labs, imaging studies, and discussing plan for follow up.   [place] Controlled Substance Database was reviewed by me for overdose risk score (ORS)   This patient was seen by Sallyanne Kuster, FNP-C in collaboration with Dr. Beverely Risen as a part of collaborative care agreement.   Draya Felker R. Tedd Sias, MSN, FNP-C Internal Medicine

## 2023-11-15 ENCOUNTER — Encounter: Payer: Self-pay | Admitting: Nurse Practitioner

## 2023-11-15 DIAGNOSIS — F3340 Major depressive disorder, recurrent, in remission, unspecified: Secondary | ICD-10-CM | POA: Insufficient documentation

## 2023-11-15 DIAGNOSIS — Z85850 Personal history of malignant neoplasm of thyroid: Secondary | ICD-10-CM | POA: Insufficient documentation

## 2023-11-15 DIAGNOSIS — K2270 Barrett's esophagus without dysplasia: Secondary | ICD-10-CM | POA: Insufficient documentation

## 2023-11-15 DIAGNOSIS — Z77098 Contact with and (suspected) exposure to other hazardous, chiefly nonmedicinal, chemicals: Secondary | ICD-10-CM | POA: Insufficient documentation

## 2023-12-28 ENCOUNTER — Ambulatory Visit: Payer: Medicare Other | Admitting: Nurse Practitioner

## 2023-12-28 ENCOUNTER — Encounter: Payer: Self-pay | Admitting: Nurse Practitioner

## 2023-12-28 VITALS — BP 114/78 | HR 87 | Temp 98.4°F | Resp 16 | Ht 72.0 in | Wt 227.4 lb

## 2023-12-28 DIAGNOSIS — Z0000 Encounter for general adult medical examination without abnormal findings: Secondary | ICD-10-CM

## 2023-12-28 DIAGNOSIS — E890 Postprocedural hypothyroidism: Secondary | ICD-10-CM

## 2023-12-28 DIAGNOSIS — N3281 Overactive bladder: Secondary | ICD-10-CM

## 2023-12-28 DIAGNOSIS — I10 Essential (primary) hypertension: Secondary | ICD-10-CM

## 2023-12-28 DIAGNOSIS — I480 Paroxysmal atrial fibrillation: Secondary | ICD-10-CM | POA: Diagnosis not present

## 2023-12-28 NOTE — Progress Notes (Signed)
 [facility] [address] [place], [place] [address]  Internal MEDICINE  Office Visit Note  Patient Name: Darren Tran  [id number]  [id number]  Date of Service: 12/28/2023  Chief Complaint  Patient presents with   Depression   Gastroesophageal Reflux   Hyperlipidemia   Medicare Wellness    HPI Darren Tran presents for a medicare annual wellness visit.  Well-appearing 79 y.o. male with AFIB, hypertension, postsurgical hypothyroidism, OAB, OSA and History of thyroid  cancer, prostate cancer, knee atrthritis, tens unit surgically placed in back.  Routine CRC screening: [facility], not [facility] now.  Labs: labs are up to date, done by [place]  New or worsening pain: left eye pain from styes Other concerns: none  Lost 9 lbs since last office visit.  Mentioned a possible stye in the left eye, on the inner part but this resolved now there is a stye on the lateral side of the eye.      12/28/2023    9:38 AM  MMSE - Mini Mental State Exam  Orientation to time 5  Orientation to Place 0  Registration 3  Attention/ Calculation 5  Recall 3  Language- name 2 objects 2  Language- repeat 1  Language- follow 3 step command 3  Language- read & follow direction 1  Write a sentence 1  Copy design 1  Total score 25    Functional Status Survey: Is the patient deaf or have difficulty hearing?: Yes Does the patient have difficulty seeing, even when wearing glasses/contacts?: Yes Does the patient have difficulty concentrating, remembering, or making decisions?: Yes Does the patient have difficulty walking or climbing stairs?: Yes Does the patient have difficulty dressing or bathing?: No Does the patient have difficulty doing errands alone such as visiting a doctor's office or shopping?: No     04/04/2016   11:50 AM 01/24/2019   11:00 AM 03/04/2021   12:09 PM 07/17/2022    9:09 AM 12/28/2023    9:37 AM  Fall Risk  Falls in the past year? No  0 0 0  Was  there an injury with Fall?   0 0 0  Fall Risk Category Calculator   0 0 0  Fall Risk Category (Retired)   Low Low   (RETIRED) Patient Fall Risk Level  Low fall risk Low fall risk Low fall risk   Patient at Risk for Falls Due to   No Fall Risks No Fall Risks No Fall Risks  Fall risk Follow up   Falls evaluation completed Falls prevention discussed;Falls evaluation completed Falls evaluation completed       07/17/2022    9:05 AM  Depression screen PHQ 2/9  Decreased Interest 1  Down, Depressed, Hopeless 1  PHQ - 2 Score 2  Altered sleeping 1  Tired, decreased energy 1  Change in appetite 0  Feeling bad or failure about yourself  0  Trouble concentrating 0  Moving slowly or fidgety/restless 0  Suicidal thoughts 0  PHQ-9 Score 4  Difficult doing work/chores Somewhat difficult      Current Medication: Outpatient Encounter Medications as of 12/28/2023  Medication Sig   albuterol  (PROVENTIL  HFA) 108 (90 Base) MCG/ACT inhaler Inhale 2 puffs into the lungs every 4 (four) hours as needed for wheezing or shortness of breath.   apixaban (ELIQUIS) 5 MG TABS tablet Take 5 mg by mouth 2 (two) times daily. Patient takes 1 tablet 1 tablet in the morning and 1 tablet at  bedtime   atenolol (TENORMIN) 50 MG tablet TAKE 1 TABLET DAILY   azelastine (ASTELIN) 0.1 % nasal spray USE 1 SPRAY IN EACH NOSTRIL  PRN   Cholecalciferol 25 MCG (1000 UT) tablet Take by mouth.   cyanocobalamin (VITAMIN B12) 500 MCG tablet Take 4 tablets by mouth daily.   Docusate Sodium (DSS) 100 MG CAPS TAKE 2 CAPSULES BY MOUTH AS DIRECTED BY YOUR PROVIDER   DULoxetine (CYMBALTA) 30 MG capsule Take 30 mg by mouth daily.   ezetimibe (ZETIA) 10 MG tablet Take 1 tablet by mouth daily.   fenofibrate 160 MG tablet TAKE ONE TABLET BY MOUTH EVERY OTHER DAY HYPERLIPIDEMIA   Ferrous Gluconate-C-Folic Acid (IRON-C PO) Take 65 mg by mouth daily at 2 PM.   fesoterodine  (TOVIAZ ) 4 MG TB24 tablet Take 1 tablet (4 mg total) by mouth daily.    ipratropium (ATROVENT) 0.06 % nasal spray SPRAY 2 SPRAYS INTO EACH NOSTRIL THREE TIMES A DAY AS NEEDED FOR NASAL CONGESTION   levothyroxine (SYNTHROID) 200 MCG tablet TAKE ONE TABLET BY MOUTH EVERY MORNING HYPOTHYROIDISM   Melatonin 5 MG TABS Take by mouth at bedtime as needed.   mometasone (NASONEX) 50 MCG/ACT nasal spray USE 1 SPRAY IN EACH NOSTRIL  PRN   Multiple Vitamin (MULTIVITAMIN) capsule Take by mouth.   prazosin (MINIPRESS) 1 MG capsule TAKE 3 CAPSULES AT BEDTIME (Patient taking differently: Take 3 mg by mouth at bedtime.)   promethazine -dextromethorphan (PROMETHAZINE -DM) 6.25-15 MG/5ML syrup Take 5 mLs by mouth at bedtime as needed.   rosuvastatin  (CRESTOR ) 10 MG tablet TAKE 1 TABLET DAILY   tretinoin (RETIN-A) 0.05 % cream    triamcinolone  cream (KENALOG ) 0.1 % Apply 1 Application topically 2 (two) times daily. To affected areas until resolved   vitamin B-12 (CYANOCOBALAMIN) 500 MCG tablet 4 tablets (2,000 mcg total).   No facility-administered encounter medications on file as of 12/28/2023.    Surgical History: Past Surgical History:  Procedure Laterality Date   COLONOSCOPY WITH PROPOFOL  N/A 01/24/2019   Procedure: COLONOSCOPY WITH BIOPSY;  Surgeon: Marnee Sink, MD;  Location: [facility];  Service: Endoscopy;  Laterality: N/A;  sleep apnea   KNEE ARTHROSCOPY     NOSE SURGERY     POLYPECTOMY N/A 01/24/2019   Procedure: POLYPECTOMY;  Surgeon: Marnee Sink, MD;  Location: [facility];  Service: Endoscopy;  Laterality: N/A;   PROSTATECTOMY     SHOULDER ARTHROSCOPY     WISDOM TOOTH EXTRACTION      Medical History: Past Medical History:  Diagnosis Date   Arthritis    Atrial fibrillation (HCC)    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   Gastrointestinal hemorrhage 08/29/2020   GERD (gastroesophageal reflux disease)    History of prostate cancer    Hyperlipidemia 09/09/2018   Major depressive disorder, single episode, moderate  (HCC) 07/17/2022   Personal history of prostate cancer 08/26/2017   Sciatica    bilateral, intermittent   Sleep apnea    CPAP   Thyroid  cancer (HCC) 09/26/2020    Family History: Family History  Problem Relation Age of Onset   Prostate cancer Neg Hx    Bladder Cancer Neg Hx    Kidney cancer Neg Hx     Social History   Socioeconomic History   Marital status: Married    Spouse name: Matthew Cina   Number of children: 2   Years of education: 12   Highest education level: 12th grade  Occupational History   Not on  file  Tobacco Use   Smoking status: Never   Smokeless tobacco: Never  Vaping Use   Vaping status: Never Used  Substance and Sexual Activity   Alcohol use: Yes    Comment: very occasional   Drug use: No   Sexual activity: Not Currently    Birth control/protection: None  Other Topics Concern   Not on file  Social History Narrative   Not on file   Social Drivers of Health   Financial Resource Strain: Low Risk  (07/17/2022)   Overall Financial Resource Strain (CARDIA)    Difficulty of Paying Living Expenses: Not hard at all  Food Insecurity: No Food Insecurity (07/17/2022)   Hunger Vital Sign    Worried About Running Out of Food in the Last Year: Never true    Ran Out of Food in the Last Year: Never true  Transportation Needs: No Transportation Needs (07/17/2022)   PRAPARE - Administrator, Civil Service (Medical): No    Lack of Transportation (Non-Medical): No  Physical Activity: Insufficiently Active (07/17/2022)   Exercise Vital Sign    Days of Exercise per Week: 2 days    Minutes of Exercise per Session: 60 min  Stress: Stress Concern Present (07/17/2022)   [employer] of Occupational Health - Occupational Stress Questionnaire    Feeling of Stress : To some extent  Social Connections: Socially Isolated (07/17/2022)   Social Connection and Isolation Panel [NHANES]    Frequency of Communication with Friends and Family: Twice a week     Frequency of Social Gatherings with Friends and Family: Never    Attends Religious Services: Never    Database administrator or Organizations: No    Attends Banker Meetings: Never    Marital Status: Married  Catering manager Violence: Not At Risk (07/17/2022)   Humiliation, Afraid, Rape, and Kick questionnaire    Fear of Current or Ex-Partner: No    Emotionally Abused: No    Physically Abused: No    Sexually Abused: No      Review of Systems  Constitutional:  Negative for activity change, appetite change, chills, fatigue, fever and unexpected weight change.  HENT: Negative.  Negative for congestion, ear pain, postnasal drip, rhinorrhea, sneezing, sore throat and trouble swallowing.   Eyes: Negative.  Negative for redness.  Respiratory: Negative.  Negative for cough, chest tightness, shortness of breath and wheezing.   Cardiovascular: Negative.  Negative for chest pain and palpitations.  Gastrointestinal: Negative.  Negative for abdominal pain, blood in stool, constipation, diarrhea, nausea and vomiting.  Endocrine: Negative.   Genitourinary: Negative.  Negative for difficulty urinating, dysuria, frequency, hematuria and urgency.  Musculoskeletal: Negative.  Negative for arthralgias, back pain, joint swelling, myalgias and neck pain.  Skin: Negative.  Negative for rash and wound.  Allergic/Immunologic: Negative.  Negative for immunocompromised state.  Neurological: Negative.  Negative for dizziness, tremors, seizures, numbness and headaches.  Hematological: Negative.  Negative for adenopathy. Does not bruise/bleed easily.  Psychiatric/Behavioral: Negative.  Negative for behavioral problems (Depression), self-injury, sleep disturbance and suicidal ideas. The patient is not nervous/anxious.     Vital Signs: BP 114/78   Pulse 87   Temp 98.4 F (36.9 C)   Resp 16   Ht 6' (1.829 m)   Wt 227 lb 6.4 oz (103.1 kg)   SpO2 96%   BMI 30.84 kg/m    Physical Exam Vitals  reviewed.  Constitutional:      General: He is not in acute  distress.    Appearance: Normal appearance. He is obese. He is not ill-appearing.  HENT:     Head: Normocephalic and atraumatic.  Eyes:     Pupils: Pupils are equal, round, and reactive to light.  Cardiovascular:     Rate and Rhythm: Normal rate and regular rhythm.  Pulmonary:     Effort: Pulmonary effort is normal. No respiratory distress.  Skin:    General: Skin is warm.  Neurological:     Mental Status: He is alert and oriented to person, place, and time.  Psychiatric:        Mood and Affect: Mood normal.        Behavior: Behavior normal.        Assessment/Plan: 1. Encounter for subsequent annual wellness visit (AWV) in Medicare patient (Primary) Age-appropriate preventive screenings and vaccinations discussed. Routine labs for health maintenance up to date, done at the [place] in January. PHM updated.    2. Essential hypertension Stable, continue atenolol as prescribed.   3. Paroxysmal atrial fibrillation [facility]) Managed by cardiology with the [place]. Currently taking eliquis and atenolol as prescribed.   4. Postoperative hypothyroidism History of thyroid  cancer s/p [facility]roidectomy. [facility]  5. Overactive bladder Sees urology      General Counseling: kazimir hartnett understanding of the findings of todays visit and agrees with plan of treatment. I have discussed any further diagnostic evaluation that may be needed or ordered today. We also reviewed his medications today. he has been encouraged to call the office with any questions or concerns that should arise related to todays visit.    No orders of the defined types were placed in this encounter.   No orders of the defined types were placed in this encounter.   Return in about 1 year (around 12/27/2024) for AWV, Kieli Golladay PCP and otherwise as needed .   Total time spent:30 Minutes Time spent includes review of chart, medications, test results, and  follow up plan with the patient.   [place] Controlled Substance Database was reviewed by me.  This patient was seen by Laurence Pons, FNP-C in collaboration with Dr. Verneta Gone as a part of collaborative care agreement.  Arelis Neumeier R. Bobbi Burow, MSN, FNP-C Internal medicine

## 2024-06-20 ENCOUNTER — Other Ambulatory Visit: Payer: Self-pay | Admitting: Nurse Practitioner

## 2024-06-20 MED ORDER — IPRATROPIUM BROMIDE 0.06 % NA SOLN: 2.0000 | Freq: Two times a day (BID) | NASAL | 5 refills | Status: AC

## 2024-06-30 ENCOUNTER — Telehealth: Payer: Self-pay | Admitting: Nurse Practitioner

## 2024-06-30 NOTE — Telephone Encounter (Signed)
 Most recent office note discussing blood pressure control faxed to [facility]; [phone]

## 2024-08-05 ENCOUNTER — Telehealth: Payer: Self-pay | Admitting: Gastroenterology

## 2024-08-05 NOTE — Telephone Encounter (Signed)
 Good afternoon Dr. Wilhelmenia,   Patients wife stating that the [facility] sent over referral for patient to have a EUS and she wanted to go ahead and scheduled patient. She states that patients father and brothers died from cancer and he has had cancer 3 times and is urgently needing to be seen.  Will you please review records in patients chart and advise on scheduling patient?  Thank you.

## 2024-08-08 NOTE — Telephone Encounter (Signed)
 I will review by end of week. GM

## 2024-08-09 NOTE — Telephone Encounter (Signed)
 Please let referring know that unfortunately, I am at bandwidth of availability and would not be scheduling any procedures until March. They will need to refer to another [facility] for evaluation if they want something sooner. If they (referring or patient) want to stay in [place], then we can schedule for a clinic visit and work on scheduling a procedure. Let me know what they decide and then we can work with my team to schedule if necessary. Thanks. GM

## 2024-08-15 ENCOUNTER — Encounter: Payer: Self-pay | Admitting: *Deleted

## 2024-08-15 NOTE — Progress Notes (Unsigned)
 Gustavo KANDICE Coddington Sr.                                          MRN: [id number]   08/15/2024   The [facility] Quality Team Specialist reviewed this patient medical record for the purposes of chart review for care gap closure. The following were reviewed: {CHL AMB [facility] QUALITY SPECIALIST REASON FOR REVIEW:[id number]}.    [facility] Quality Team

## 2024-08-16 NOTE — Telephone Encounter (Signed)
 Spoke with patient he would like to stay in the area. Patient was scheduled to see Dr. Wilhelmenia on 2/18 at 2:10

## 2024-09-28 ENCOUNTER — Ambulatory Visit: Admitting: Gastroenterology

## 2024-12-28 ENCOUNTER — Ambulatory Visit: Admitting: Nurse Practitioner

## 2024-12-30 ENCOUNTER — Ambulatory Visit: Admitting: Nurse Practitioner
